# Patient Record
Sex: Male | Born: 1955
Health system: Southern US, Community
[De-identification: ages and names within clinical notes are randomized; demographics above are authoritative.]

## PROBLEM LIST (undated history)

## (undated) DIAGNOSIS — M1990 Unspecified osteoarthritis, unspecified site: Secondary | ICD-10-CM

## (undated) DIAGNOSIS — J449 Chronic obstructive pulmonary disease, unspecified: Secondary | ICD-10-CM

## (undated) DIAGNOSIS — E785 Hyperlipidemia, unspecified: Secondary | ICD-10-CM

## (undated) DIAGNOSIS — Z9289 Personal history of other medical treatment: Secondary | ICD-10-CM

## (undated) DIAGNOSIS — I10 Essential (primary) hypertension: Secondary | ICD-10-CM

## (undated) DIAGNOSIS — J189 Pneumonia, unspecified organism: Secondary | ICD-10-CM

## (undated) DIAGNOSIS — IMO0001 Reserved for inherently not codable concepts without codable children: Secondary | ICD-10-CM

## (undated) HISTORY — DX: Hyperlipidemia, unspecified: E78.5

---
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
## 1989-03-30 HISTORY — PX: BACK SURGERY: SHX140

## 1999-03-31 HISTORY — PX: OTHER SURGICAL HISTORY: SHX169

## 1999-09-18 ENCOUNTER — Ambulatory Visit: Admission: RE | Admit: 1999-09-18 | Discharge: 1999-09-18 | Payer: Self-pay | Admitting: Cardiovascular Disease

## 1999-10-27 ENCOUNTER — Ambulatory Visit (HOSPITAL_COMMUNITY): Admission: RE | Admit: 1999-10-27 | Discharge: 1999-10-27 | Payer: Self-pay | Admitting: Orthopaedic Surgery

## 2006-10-07 ENCOUNTER — Emergency Department: Payer: Self-pay | Admitting: Emergency Medicine

## 2013-03-20 ENCOUNTER — Encounter: Payer: Self-pay | Admitting: Orthopedic Surgery

## 2013-03-30 ENCOUNTER — Encounter: Payer: Self-pay | Admitting: Orthopedic Surgery

## 2013-04-30 ENCOUNTER — Encounter: Payer: Self-pay | Admitting: Orthopedic Surgery

## 2013-05-28 ENCOUNTER — Encounter: Payer: Self-pay | Admitting: Orthopedic Surgery

## 2013-12-21 ENCOUNTER — Encounter: Payer: Self-pay | Admitting: Podiatry

## 2013-12-21 ENCOUNTER — Ambulatory Visit (INDEPENDENT_AMBULATORY_CARE_PROVIDER_SITE_OTHER): Payer: Medicaid Other | Admitting: Podiatry

## 2013-12-21 VITALS — BP 139/72 | HR 99 | Resp 16 | Ht 68.0 in | Wt 195.0 lb

## 2013-12-21 DIAGNOSIS — L608 Other nail disorders: Secondary | ICD-10-CM

## 2013-12-21 DIAGNOSIS — L600 Ingrowing nail: Secondary | ICD-10-CM

## 2013-12-21 DIAGNOSIS — D492 Neoplasm of unspecified behavior of bone, soft tissue, and skin: Secondary | ICD-10-CM

## 2013-12-21 DIAGNOSIS — Q828 Other specified congenital malformations of skin: Secondary | ICD-10-CM

## 2013-12-21 NOTE — Progress Notes (Signed)
   Subjective:    [name], male    DOB: 10-16-1955, 58 y.o.   MRN: [id number]  HPI Comments: i have places on the bottom of my feet. Ive had them for a long time. The places on my feet hurt. It hurts to walk and stand. i use razor blades on my feet, a brush on my feet, pedi egg, and file my feet.  Foot Pain Associated symptoms include coughing.      Review of Systems  HENT: Positive for sinus pressure.   Respiratory: Positive for cough, shortness of breath and wheezing.   Cardiovascular: Positive for leg swelling.  Endocrine:       Excessive thirst  Musculoskeletal: Positive for back pain.       Joint pain Difficulty walking  All other systems reviewed and are negative.      Objective:   Physical Exam: Reviewed his past medical history medications allergies surgeries social history and review of systems. Pulses are strongly palpable bilateral. Neurologic sensorium is intact per Semmes-Weinstein monofilament. Deep tendon reflexes are intact bilateral muscle strength is 5 over 5 dorsiflexors plantar flexors inverters everters all intrinsic musculature is intact. The evaluation demonstrates all joints distal to the ankle a full range of motion without crepitation. Cutaneous evaluation demonstrates supple well hydrated cutis porokeratotic lesion sub-first and sub-fifth bilaterally. He also has thick dystrophic possibly mycotic nails which are painful on debridement and on palpation.        Assessment & Plan:  Assessment: Porokeratosis bilateral foot. Nail dystrophy bilateral foot.  Plan: Discussed etiology pathology conservative versus surgical therapies. For example the nails and skin today from the hallux nails bilaterally and sent for pathologic evaluation. I also denucleated lesions today he can actually sub-fifth bilaterally. I then applied salicylic acid under occlusion to the left for 3 days and then washed off thoroughly we discussed the possible need for  orthotics and I will followup with him in the near future.

## 2014-01-08 ENCOUNTER — Encounter: Payer: Self-pay | Admitting: Podiatry

## 2015-01-22 ENCOUNTER — Ambulatory Visit: Payer: Self-pay | Admitting: Orthopedic Surgery

## 2015-01-22 NOTE — Progress Notes (Signed)
Preoperative surgical orders have been place into the Epic hospital system for David Anderson on 01/22/2015, 5:28 PM  by Mickel Crow for surgery on 02-11-15.  Preop Total Hip - Anterior Approach orders including IV Tylenol, and IV Decadron as long as there are no contraindications to the above medications. Arlee Muslim, PA-C

## 2015-01-29 ENCOUNTER — Other Ambulatory Visit (HOSPITAL_COMMUNITY): Payer: Self-pay | Admitting: *Deleted

## 2015-01-29 NOTE — Patient Instructions (Addendum)
David Anderson  01/29/2015   Your procedure is scheduled on: 02-11-15  Report to [facility]  Entrance take [facility]  elevators to 3rd floor to  [place] at 1155 am  Call this number if you have problems the morning of surgery [phone]   Remember: ONLY 1 PERSON MAY GO WITH YOU TO SHORT STAY TO GET  READY MORNING OF YOUR SURGERY.  Do not eat food  :After Midnight, clear liquids midnight until 900 am day of surgery, nothing by mouth after 920 am day of surgery.      Take these medicines the morning of surgery with A SIP OF WATER: flonase nasal spray, spriva, advair DO NOT TAKE ANY DIABETIC MEDICATIONS DAY OF YOUR SURGERY                               You may not have any metal on your body including hair pins and              piercings  Do not wear jewelry, make-up, lotions, powders or perfumes, deodorant             Do not wear nail polish.  Do not shave  48 hours prior to surgery.              Men may shave face and neck.   Do not bring valuables to the hospital. [place].  Contacts, dentures or bridgework may not be worn into surgery.  Leave suitcase in the car. After surgery it may be brought to your room.     Patients discharged the day of surgery will not be allowed to drive home.  Name and phone number of your driver:  Special Instructions: N/A              Please read over the following fact sheets you were given: _____________________________________________________________________                CLEAR LIQUID DIET   Foods Allowed                                                                     Foods Excluded  Coffee and tea, regular and decaf                             liquids that you cannot  Plain Jell-O in any flavor                                             see through such as: Fruit ices (not with fruit pulp)                                     milk, soups, orange  juice  Iced  Popsicles                                    All solid food Carbonated beverages, regular and diet                                    Cranberry, grape and apple juices Sports drinks like Gatorade Lightly seasoned clear broth or consume(fat free) Sugar, honey syrup  Sample Menu Breakfast                                Lunch                                     Supper Cranberry juice                    Beef broth                            Chicken broth Jell-O                                     Grape juice                           Apple juice Coffee or tea                        Jell-O                                      Popsicle                                                Coffee or tea                        Coffee or tea  _____________________________________________________________________  [facility] - Preparing for Surgery Before surgery, you can play an important role.  Because skin is not sterile, your skin needs to be as free of germs as possible.  You can reduce the number of germs on your skin by washing with CHG (chlorahexidine gluconate) soap before surgery.  CHG is an antiseptic cleaner which kills germs and bonds with the skin to continue killing germs even after washing. Please DO NOT use if you have an allergy to CHG or antibacterial soaps.  If your skin becomes reddened/irritated stop using the CHG and inform your nurse when you arrive at Short Stay. Do not shave (including legs and underarms) for at least 48 hours prior to the first CHG shower.  You may shave your face/neck. Please follow these instructions carefully:  1.  Shower with CHG Soap the night before surgery and the  morning of Surgery.  2.  If you choose to wash your hair, wash your hair first  as usual with your  normal  shampoo.  3.  After you shampoo, rinse your hair and body thoroughly to remove the  shampoo.                           4.  Use CHG as you would any other liquid soap.  You can  apply chg directly  to the skin and wash                       Gently with a scrungie or clean washcloth.  5.  Apply the CHG Soap to your body ONLY FROM THE NECK DOWN.   Do not use on face/ open                           Wound or open sores. Avoid contact with eyes, ears mouth and genitals (private parts).                       Wash face,  Genitals (private parts) with your normal soap.             6.  Wash thoroughly, paying special attention to the area where your surgery  will be performed.  7.  Thoroughly rinse your body with warm water from the neck down.  8.  DO NOT shower/wash with your normal soap after using and rinsing off  the CHG Soap.                9.  Pat yourself dry with a clean towel.            10.  Wear clean pajamas.            11.  Place clean sheets on your bed the night of your first shower and do not  sleep with pets. Day of Surgery : Do not apply any lotions/deodorants the morning of surgery.  Please wear clean clothes to the hospital/surgery center.  FAILURE TO FOLLOW THESE INSTRUCTIONS MAY RESULT IN THE CANCELLATION OF YOUR SURGERY PATIENT SIGNATURE_________________________________  NURSE SIGNATURE__________________________________  ________________________________________________________________________   David Anderson  An incentive spirometer is a tool that can help keep your lungs clear and active. This tool measures how well you are filling your lungs with each breath. Taking long deep breaths may help reverse or decrease the chance of developing breathing (pulmonary) problems (especially infection) following:  A long period of time when you are unable to move or be active. BEFORE THE PROCEDURE   If the spirometer includes an indicator to show your best effort, your nurse or respiratory therapist will set it to a desired goal.  If possible, sit up straight or lean slightly forward. Try not to slouch.  Hold the incentive spirometer in an upright  position. INSTRUCTIONS FOR USE   Sit on the edge of your bed if possible, or sit up as far as you can in bed or on a chair.  Hold the incentive spirometer in an upright position.  Breathe out normally.  Place the mouthpiece in your mouth and seal your lips tightly around it.  Breathe in slowly and as deeply as possible, raising the piston or the ball toward the top of the column.  Hold your breath for 3-5 seconds or for as long as possible. Allow the piston or ball to fall to the bottom of the column.  Remove the  mouthpiece from your mouth and breathe out normally.  Rest for a few seconds and repeat Steps 1 through 7 at least 10 times every 1-2 hours when you are awake. Take your time and take a few normal breaths between deep breaths.  The spirometer may include an indicator to show your best effort. Use the indicator as a goal to work toward during each repetition.  After each set of 10 deep breaths, practice coughing to be sure your lungs are clear. If you have an incision (the cut made at the time of surgery), support your incision when coughing by placing a pillow or rolled up towels firmly against it. Once you are able to get out of bed, walk around indoors and cough well. You may stop using the incentive spirometer when instructed by your caregiver.  RISKS AND COMPLICATIONS  Take your time so you do not get dizzy or light-headed.  If you are in pain, you may need to take or ask for pain medication before doing incentive spirometry. It is harder to take a deep breath if you are having pain. AFTER USE  Rest and breathe slowly and easily.  It can be helpful to keep track of a log of your progress. Your caregiver can provide you with a simple table to help with this. If you are using the spirometer at home, follow these instructions: Oval IF:   You are having difficultly using the spirometer.  You have trouble using the spirometer as often as instructed.  Your  pain medication is not giving enough relief while using the spirometer.  You develop fever of 100.5 F (38.1 C) or higher. SEEK IMMEDIATE MEDICAL CARE IF:   You cough up bloody sputum that had not been present before.  You develop fever of 102 F (38.9 C) or greater.  You develop worsening pain at or near the incision site. MAKE SURE YOU:   Understand these instructions.  Will watch your condition.  Will get help right away if you are not doing well or get worse. Document Released: 07/27/2006 Document Revised: 06/08/2011 Document Reviewed: 09/27/2006 Exit[employer] Patient Information 2014 [employer], [place].   ________________________________________________________________________  WHAT IS A BLOOD TRANSFUSION? Blood Transfusion Information  A transfusion is the replacement of blood or some of its parts. Blood is made up of multiple cells which provide different functions.  Red blood cells carry oxygen and are used for blood loss replacement.  White blood cells fight against infection.  Platelets control bleeding.  Plasma helps clot blood.  Other blood products are available for specialized needs, such as hemophilia or other clotting disorders. BEFORE THE TRANSFUSION  Who gives blood for transfusions?   Healthy volunteers who are fully evaluated to make sure their blood is safe. This is blood bank blood. Transfusion therapy is the safest it has ever been in the practice of medicine. Before blood is taken from a donor, a complete history is taken to make sure that person has no history of diseases nor engages in risky social behavior (examples are intravenous drug use or sexual activity with multiple partners). The donor's travel history is screened to minimize risk of transmitting infections, such as malaria. The donated blood is tested for signs of infectious diseases, such as HIV and hepatitis. The blood is then tested to be sure it is compatible with you in order to minimize the  chance of a transfusion reaction. If you or a relative donates blood, this is often done in anticipation of surgery  and is not appropriate for emergency situations. It takes many days to process the donated blood. RISKS AND COMPLICATIONS Although transfusion therapy is very safe and saves many lives, the main dangers of transfusion include:   Getting an infectious disease.  Developing a transfusion reaction. This is an allergic reaction to something in the blood you were given. Every precaution is taken to prevent this. The decision to have a blood transfusion has been considered carefully by your caregiver before blood is given. Blood is not given unless the benefits outweigh the risks. AFTER THE TRANSFUSION  Right after receiving a blood transfusion, you will usually feel much better and more energetic. This is especially true if your red blood cells have gotten low (anemic). The transfusion raises the level of the red blood cells which carry oxygen, and this usually causes an energy increase.  The nurse administering the transfusion will monitor you carefully for complications. HOME CARE INSTRUCTIONS  No special instructions are needed after a transfusion. You may find your energy is better. Speak with your caregiver about any limitations on activity for underlying diseases you may have. SEEK MEDICAL CARE IF:   Your condition is not improving after your transfusion.  You develop redness or irritation at the intravenous (IV) site. SEEK IMMEDIATE MEDICAL CARE IF:  Any of the following symptoms occur over the next 12 hours:  Shaking chills.  You have a temperature by mouth above 102 F (38.9 C), not controlled by medicine.  Chest, back, or muscle pain.  People around you feel you are not acting correctly or are confused.  Shortness of breath or difficulty breathing.  Dizziness and fainting.  You get a rash or develop hives.  You have a decrease in urine output.  Your urine  turns a dark color or changes to pink, red, or brown. Any of the following symptoms occur over the next 10 days:  You have a temperature by mouth above 102 F (38.9 C), not controlled by medicine.  Shortness of breath.  Weakness after normal activity.  The white part of the eye turns yellow (jaundice).  You have a decrease in the amount of urine or are urinating less often.  Your urine turns a dark color or changes to pink, red, or brown. Document Released: 03/13/2000 Document Revised: 06/08/2011 Document Reviewed: 10/31/2007 [facility] Patient Information 2014 [place], [place].  _______________________________________________________________________

## 2015-01-30 ENCOUNTER — Encounter (HOSPITAL_COMMUNITY): Payer: Self-pay

## 2015-01-30 ENCOUNTER — Encounter (HOSPITAL_COMMUNITY)
Admission: RE | Admit: 2015-01-30 | Discharge: 2015-01-30 | Disposition: A | Discharge status: Home / Self Care | Payer: 59 | Source: Ambulatory Visit | Attending: Orthopedic Surgery | Admitting: Orthopedic Surgery

## 2015-01-30 DIAGNOSIS — Z01818 Encounter for other preprocedural examination: Secondary | ICD-10-CM | POA: Diagnosis present

## 2015-01-30 DIAGNOSIS — M1612 Unilateral primary osteoarthritis, left hip: Secondary | ICD-10-CM | POA: Insufficient documentation

## 2015-01-30 HISTORY — DX: Pneumonia, unspecified organism: J18.9

## 2015-01-30 HISTORY — DX: Chronic obstructive pulmonary disease, unspecified: J44.9

## 2015-01-30 HISTORY — DX: Personal history of other medical treatment: Z92.89

## 2015-01-30 HISTORY — DX: Reserved for inherently not codable concepts without codable children: IMO0001

## 2015-01-30 HISTORY — DX: Essential (primary) hypertension: I10

## 2015-01-30 HISTORY — DX: Unspecified osteoarthritis, unspecified site: M19.90

## 2015-01-30 LAB — COMPREHENSIVE METABOLIC PANEL
ALK PHOS: 49 U/L (ref 38–126)
ALT: 51 U/L (ref 17–63)
AST: 41 U/L (ref 15–41)
Albumin: 4.4 g/dL (ref 3.5–5.0)
Anion gap: 8 (ref 5–15)
BUN: 22 mg/dL — AB (ref 6–20)
CHLORIDE: 99 mmol/L — AB (ref 101–111)
CO2: 29 mmol/L (ref 22–32)
CREATININE: 1.09 mg/dL (ref 0.61–1.24)
Calcium: 9.6 mg/dL (ref 8.9–10.3)
GFR calc Af Amer: >60 mL/min (ref >60)
GFR calc non Af Amer: >60 mL/min (ref >60)
Glucose, Bld: 107 mg/dL — ABNORMAL HIGH (ref 65–99)
Potassium: 4.5 mmol/L (ref 3.5–5.1)
SODIUM: 136 mmol/L (ref 135–145)
Total Bilirubin: 0.6 mg/dL (ref 0.3–1.2)
Total Protein: 7.1 g/dL (ref 6.5–8.1)

## 2015-01-30 LAB — CBC
HCT: 45.5 % (ref 39.0–52.0)
Hemoglobin: 15.7 g/dL (ref 13.0–17.0)
MCH: 32.2 pg (ref 26.0–34.0)
MCHC: 34.5 g/dL (ref 30.0–36.0)
MCV: 93.2 fL (ref 78.0–100.0)
PLATELETS: 191 10*3/uL (ref 150–400)
RBC: 4.88 MIL/uL (ref 4.22–5.81)
RDW: 12.8 % (ref 11.5–15.5)
WBC: 8 10*3/uL (ref 4.0–10.5)

## 2015-01-30 LAB — URINALYSIS, ROUTINE W REFLEX MICROSCOPIC
BILIRUBIN URINE: NEGATIVE
Glucose, UA: NEGATIVE mg/dL
Hgb urine dipstick: NEGATIVE
KETONES UR: NEGATIVE mg/dL
Leukocytes, UA: NEGATIVE
NITRITE: NEGATIVE
PROTEIN: NEGATIVE mg/dL
Specific Gravity, Urine: 1.02 (ref 1.005–1.030)
UROBILINOGEN UA: 0.2 mg/dL (ref 0.0–1.0)
pH: 6 (ref 5.0–8.0)

## 2015-01-30 LAB — TYPE AND SCREEN
ABO/RH(D): A POS
Antibody Screen: NEGATIVE

## 2015-01-30 LAB — APTT: aPTT: 35 seconds (ref 24–37)

## 2015-01-30 LAB — SURGICAL PCR SCREEN
MRSA, PCR: NEGATIVE
STAPHYLOCOCCUS AUREUS: NEGATIVE

## 2015-01-30 LAB — PROTIME-INR
INR: 1.05 (ref 0.00–1.49)
Prothrombin Time: 13.9 seconds (ref 11.6–15.2)

## 2015-01-30 LAB — ABO/RH: ABO/RH(D): A POS

## 2015-01-30 NOTE — Progress Notes (Signed)
Spoke with dr Marcell Barlow and made aware 01-30-15 ekg results and pt has history of abnormal ekg in past, but last ekg was 2001 and unable to get any old ekg, pt ok for sugrery per dr Marcell Barlow

## 2015-01-30 NOTE — Progress Notes (Signed)
   01/30/15 1313  OBSTRUCTIVE SLEEP APNEA  Have you ever been diagnosed with sleep apnea through a sleep study? No  Do you snore loudly (loud enough to be heard through closed doors)?  1  Do you often feel tired, fatigued, or sleepy during the daytime (such as falling asleep during driving or talking to someone)? 1  Has anyone observed you stop breathing during your sleep? 0  Do you have, or are you being treated for high blood pressure? 1  BMI more than 35 kg/m2? 0  Age > 50 (1-yes) 1  Neck circumference greater than:Male 16 inches or larger, Male 17inches or larger? 0  Male Gender (Yes=1) 1  Obstructive Sleep Apnea Score 5  Score 5 or greater  Results sent to PCP

## 2015-02-06 NOTE — H&P (Signed)
TOTAL HIP ADMISSION H&P  Patient is admitted for left total hip arthroplasty.  Subjective:  Chief Complaint: left hip pain  HPI: David FIDALGO, 59 y.o. male, has a history of pain and functional disability in the left hip(s) due to trauma and arthritis and patient has failed non-surgical conservative treatments for greater than 12 weeks to include NSAID's and/or analgesics, flexibility and strengthening excercises and activity modification.  Onset of symptoms was gradual starting 2 years ago with gradually worsening course since that time.The patient noted no past surgery on the left hip(s).  Patient currently rates pain in the left hip at 8 out of 10 with activity. Patient has night pain, worsening of pain with activity and weight bearing, pain that interfers with activities of daily living, pain with passive range of motion, and crepitus. Patient has evidence of subchondral cysts, subchondral sclerosis, periarticular osteophytes, joint space narrowing and necrosis of the femoral head by imaging studies. This condition presents safety issues increasing the risk of falls.   There is no current active infection.   Past Medical History  Diagnosis Date  . Hypertension   . COPD (chronic obstructive pulmonary disease) ([place])   . Arthritis     oa  . Shortness of breath dyspnea     with exertion  . Pneumonia years ago    walking pneumonia  . History of blood transfusion 1973    Past Surgical History  Procedure Laterality Date  . Surgery for staph infection  1973    both legs left worse than right, both legs open and drained  . Left shoulder rotator cuff repair  2001  . Back surgery  1991    lower back     No Known Allergies  Social History  Substance Use Topics  . Smoking status: Current Every Day Smoker -- 1.00 packs/day for 35 years    Types: Cigarettes  . Smokeless tobacco: Never Used  . Alcohol Use: Yes     Comment: social       Review of Systems  Constitutional: Negative.    HENT: Negative.   Eyes: Negative.   Respiratory: Positive for shortness of breath and wheezing. Negative for cough, hemoptysis and sputum production.        SOB with exertion  Cardiovascular: Negative.   Gastrointestinal: Negative.   Genitourinary: Negative.   Musculoskeletal: Positive for myalgias, back pain and joint pain. Negative for falls and neck pain.       Left hip pain  Skin: Negative.   Neurological: Negative.   Endo/Heme/Allergies: Negative.   Psychiatric/Behavioral: Negative.     Objective:  Physical Exam  Constitutional: He is oriented to person, place, and time. He appears well-developed and well-nourished. No distress.  HENT:  Head: Normocephalic and atraumatic.  Right Ear: External ear normal.  Left Ear: External ear normal.  Nose: Nose normal.  Mouth/Throat: Oropharynx is clear and moist.  Eyes: Conjunctivae and EOM are normal.  Neck: Normal range of motion. Neck supple.  Cardiovascular: Normal rate, regular rhythm, normal heart sounds and intact distal pulses.   No murmur heard. Respiratory: Effort normal. No respiratory distress. He has wheezes.  GI: Soft. Bowel sounds are normal. He exhibits no distension. There is no tenderness.  Musculoskeletal:       Right hip: Normal.       Left hip: He exhibits decreased range of motion and crepitus.       Right knee: Normal.       Left knee: He exhibits no effusion  and no erythema. Tenderness found. Medial joint line tenderness noted. No lateral joint line tenderness noted.  His right hip can be flexed to 120, rotated in 30, out 30, abducted 40 without discomfort. Right knee range 0 to 135. No swelling, tenderness or instability. Left hip flexion 90, no internal or external rotation, no abduction. With the exception of flexion extension, the hip is almost fused. His left knee shows lot of scarring medially from his old infection. His range is about 0 to 125. There is crepitus on range of motion, slight tenderness  medially and no lateral tenderness or instability noted.  Neurological: He is alert and oriented to person, place, and time. He has normal strength and normal reflexes. No sensory deficit.  Skin: No rash noted. He is not diaphoretic. No erythema.  Psychiatric: He has a normal mood and affect. His behavior is normal.   Vitals  Weight: 195 lb Height: 69in Body Surface Area: 2.04 m Body Mass Index: 28.8 kg/m  Pulse: 76 (Regular)  BP: 162/84 (Sitting, Left Arm, Standard)  Imaging Review Plain radiographs demonstrate severe degenerative joint disease of the left hip(s). The bone quality appears to be fair for age and reported activity level.  Assessment/Plan:  End stage primary osteoarthritis, left hip(s)  The patient history, physical examination, clinical judgement of the provider and imaging studies are consistent with end stage degenerative joint disease of the left hip(s) and total hip arthroplasty is deemed medically necessary. The treatment options including medical management, injection therapy, arthroscopy and arthroplasty were discussed at length. The risks and benefits of total hip arthroplasty were presented and reviewed. The risks due to aseptic loosening, infection, stiffness, dislocation/subluxation,  thromboembolic complications and other imponderables were discussed.  The patient acknowledged the explanation, agreed to proceed with the plan and consent was signed. Patient is being admitted for inpatient treatment for surgery, pain control, PT, OT, prophylactic antibiotics, VTE prophylaxis, progressive ambulation and ADL's and discharge planning.The patient is planning to be discharged home with home health services    PCP: Dr. Durenda Age, PA-C

## 2015-02-11 ENCOUNTER — Inpatient Hospital Stay (HOSPITAL_COMMUNITY): Payer: 59

## 2015-02-11 ENCOUNTER — Encounter (HOSPITAL_COMMUNITY)
Admission: RE | Disposition: A | Discharge status: Home Health | Payer: Self-pay | Source: Ambulatory Visit | Attending: Orthopedic Surgery

## 2015-02-11 ENCOUNTER — Inpatient Hospital Stay (HOSPITAL_COMMUNITY): Payer: 59 | Admitting: Certified Registered Nurse Anesthetist

## 2015-02-11 ENCOUNTER — Encounter (HOSPITAL_COMMUNITY): Payer: Self-pay | Admitting: *Deleted

## 2015-02-11 ENCOUNTER — Inpatient Hospital Stay (HOSPITAL_COMMUNITY)
Admission: RE | Admit: 2015-02-11 | Discharge: 2015-02-13 | DRG: 470 | Disposition: A | Discharge status: Home Health | Payer: 59 | Source: Ambulatory Visit | Attending: Orthopedic Surgery | Admitting: Orthopedic Surgery

## 2015-02-11 DIAGNOSIS — I10 Essential (primary) hypertension: Secondary | ICD-10-CM | POA: Diagnosis present

## 2015-02-11 DIAGNOSIS — Z01812 Encounter for preprocedural laboratory examination: Secondary | ICD-10-CM | POA: Diagnosis not present

## 2015-02-11 DIAGNOSIS — F17210 Nicotine dependence, cigarettes, uncomplicated: Secondary | ICD-10-CM | POA: Diagnosis present

## 2015-02-11 DIAGNOSIS — M25552 Pain in left hip: Secondary | ICD-10-CM | POA: Diagnosis present

## 2015-02-11 DIAGNOSIS — M1612 Unilateral primary osteoarthritis, left hip: Principal | ICD-10-CM | POA: Diagnosis present

## 2015-02-11 DIAGNOSIS — J449 Chronic obstructive pulmonary disease, unspecified: Secondary | ICD-10-CM | POA: Diagnosis present

## 2015-02-11 DIAGNOSIS — M169 Osteoarthritis of hip, unspecified: Secondary | ICD-10-CM | POA: Diagnosis present

## 2015-02-11 DIAGNOSIS — Z96649 Presence of unspecified artificial hip joint: Secondary | ICD-10-CM

## 2015-02-11 HISTORY — PX: TOTAL HIP ARTHROPLASTY: SHX124

## 2015-02-11 SURGERY — ARTHROPLASTY, HIP, TOTAL, ANTERIOR APPROACH
Anesthesia: Spinal | Site: Hip | Laterality: Left

## 2015-02-11 MED ORDER — PROMETHAZINE HCL 25 MG/ML IJ SOLN: 6.2500 mg | INTRAMUSCULAR | Status: DC | PRN

## 2015-02-11 MED ORDER — TRANEXAMIC ACID 1000 MG/10ML IV SOLN
1000.0000 mg | INTRAVENOUS | Status: AC
  Administered 2015-02-11: 1000 mg via INTRAVENOUS
  Filled 2015-02-11: qty 10

## 2015-02-11 MED ORDER — ACETAMINOPHEN 325 MG PO TABS: 650.0000 mg | ORAL_TABLET | Freq: Four times a day (QID) | ORAL | Status: DC | PRN

## 2015-02-11 MED ORDER — METOCLOPRAMIDE HCL 10 MG PO TABS
5.0000 mg | ORAL_TABLET | Freq: Three times a day (TID) | ORAL | Status: DC | PRN
Start: 2015-02-11 — End: 2015-02-13

## 2015-02-11 MED ORDER — PROPOFOL 10 MG/ML IV BOLUS
INTRAVENOUS | Status: AC
  Filled 2015-02-11: qty 20

## 2015-02-11 MED ORDER — CHLORHEXIDINE GLUCONATE 4 % EX LIQD: 60.0000 mL | Freq: Once | CUTANEOUS | Status: DC

## 2015-02-11 MED ORDER — ONDANSETRON HCL 4 MG/2ML IJ SOLN
INTRAMUSCULAR | Status: DC | PRN
  Administered 2015-02-11: 4 mg via INTRAVENOUS

## 2015-02-11 MED ORDER — KETOROLAC TROMETHAMINE 15 MG/ML IJ SOLN: 7.5000 mg | Freq: Four times a day (QID) | INTRAMUSCULAR | Status: AC | PRN

## 2015-02-11 MED ORDER — CEFAZOLIN SODIUM-DEXTROSE 2-3 GM-% IV SOLR
INTRAVENOUS | Status: AC
  Filled 2015-02-11: qty 50

## 2015-02-11 MED ORDER — ONDANSETRON HCL 4 MG PO TABS: 4.0000 mg | ORAL_TABLET | Freq: Four times a day (QID) | ORAL | Status: DC | PRN

## 2015-02-11 MED ORDER — FENTANYL CITRATE (PF) 100 MCG/2ML IJ SOLN: 25.0000 ug | INTRAMUSCULAR | Status: DC | PRN

## 2015-02-11 MED ORDER — LABETALOL HCL 5 MG/ML IV SOLN
INTRAVENOUS | Status: AC
  Filled 2015-02-11: qty 4

## 2015-02-11 MED ORDER — TRAZODONE HCL 50 MG PO TABS
50.0000 mg | ORAL_TABLET | Freq: Every day | ORAL | Status: DC
  Administered 2015-02-11 – 2015-02-12 (×2): 50 mg via ORAL
  Filled 2015-02-11 (×2): qty 1

## 2015-02-11 MED ORDER — CEFAZOLIN SODIUM-DEXTROSE 2-3 GM-% IV SOLR
2.0000 g | INTRAVENOUS | Status: AC
  Administered 2015-02-11: 2 g via INTRAVENOUS

## 2015-02-11 MED ORDER — FENTANYL CITRATE (PF) 100 MCG/2ML IJ SOLN
INTRAMUSCULAR | Status: DC | PRN
Start: 2015-02-11 — End: 2015-02-11
  Administered 2015-02-11: 25 ug via INTRAVENOUS
  Administered 2015-02-11: 50 ug via INTRAVENOUS
  Administered 2015-02-11: 25 ug via INTRAVENOUS

## 2015-02-11 MED ORDER — ONDANSETRON HCL 4 MG/2ML IJ SOLN
INTRAMUSCULAR | Status: AC
  Filled 2015-02-11: qty 2

## 2015-02-11 MED ORDER — PHENOL 1.4 % MT LIQD
1.0000 | OROMUCOSAL | Status: DC | PRN
  Filled 2015-02-11: qty 177

## 2015-02-11 MED ORDER — DEXAMETHASONE SODIUM PHOSPHATE 10 MG/ML IJ SOLN
10.0000 mg | Freq: Once | INTRAMUSCULAR | Status: AC
  Administered 2015-02-12: 10 mg via INTRAVENOUS
  Filled 2015-02-11: qty 1

## 2015-02-11 MED ORDER — BUPIVACAINE-EPINEPHRINE (PF) 0.25% -1:200000 IJ SOLN
INTRAMUSCULAR | Status: AC
Start: 2015-02-11 — End: 2015-02-11
  Filled 2015-02-11: qty 30

## 2015-02-11 MED ORDER — METHOCARBAMOL 1000 MG/10ML IJ SOLN
500.0000 mg | Freq: Four times a day (QID) | INTRAVENOUS | Status: DC | PRN
  Filled 2015-02-11: qty 5

## 2015-02-11 MED ORDER — ACETAMINOPHEN 10 MG/ML IV SOLN
INTRAVENOUS | Status: AC
  Filled 2015-02-11: qty 100

## 2015-02-11 MED ORDER — CEFAZOLIN SODIUM-DEXTROSE 2-3 GM-% IV SOLR: 2.0000 g | INTRAVENOUS | Status: DC

## 2015-02-11 MED ORDER — SODIUM CHLORIDE 0.9 % IJ SOLN
INTRAMUSCULAR | Status: AC
  Filled 2015-02-11: qty 50

## 2015-02-11 MED ORDER — SODIUM CHLORIDE 0.9 % IV SOLN
INTRAVENOUS | Status: DC
  Administered 2015-02-11: 19:00:00 via INTRAVENOUS

## 2015-02-11 MED ORDER — CHLORTHALIDONE 25 MG PO TABS
25.0000 mg | ORAL_TABLET | Freq: Every morning | ORAL | Status: DC
  Administered 2015-02-12 – 2015-02-13 (×2): 25 mg via ORAL
  Filled 2015-02-11 (×2): qty 1

## 2015-02-11 MED ORDER — ACETAMINOPHEN 650 MG RE SUPP: 650.0000 mg | Freq: Four times a day (QID) | RECTAL | Status: DC | PRN

## 2015-02-11 MED ORDER — MOMETASONE FURO-FORMOTEROL FUM 100-5 MCG/ACT IN AERO
2.0000 | INHALATION_SPRAY | Freq: Two times a day (BID) | RESPIRATORY_TRACT | Status: DC
  Administered 2015-02-11 – 2015-02-13 (×4): 2 via RESPIRATORY_TRACT
  Filled 2015-02-11: qty 8.8

## 2015-02-11 MED ORDER — TRAMADOL HCL 50 MG PO TABS: 50.0000 mg | ORAL_TABLET | Freq: Four times a day (QID) | ORAL | Status: DC | PRN

## 2015-02-11 MED ORDER — STERILE WATER FOR IRRIGATION IR SOLN
Status: DC | PRN
  Administered 2015-02-11: 1000 mL

## 2015-02-11 MED ORDER — SODIUM CHLORIDE 0.9 % IV SOLN: INTRAVENOUS | Status: DC

## 2015-02-11 MED ORDER — FLUTICASONE PROPIONATE 50 MCG/ACT NA SUSP
1.0000 | Freq: Every day | NASAL | Status: DC
  Administered 2015-02-12 – 2015-02-13 (×2): 1 via NASAL
  Filled 2015-02-11: qty 16

## 2015-02-11 MED ORDER — PHENYLEPHRINE HCL 10 MG/ML IJ SOLN
INTRAMUSCULAR | Status: DC | PRN
  Administered 2015-02-11: 40 ug via INTRAVENOUS
  Administered 2015-02-11 (×2): 80 ug via INTRAVENOUS

## 2015-02-11 MED ORDER — POLYETHYLENE GLYCOL 3350 17 G PO PACK: 17.0000 g | PACK | Freq: Every day | ORAL | Status: DC | PRN

## 2015-02-11 MED ORDER — FENTANYL CITRATE (PF) 100 MCG/2ML IJ SOLN
INTRAMUSCULAR | Status: AC
  Filled 2015-02-11: qty 4

## 2015-02-11 MED ORDER — DOCUSATE SODIUM 100 MG PO CAPS
100.0000 mg | ORAL_CAPSULE | Freq: Two times a day (BID) | ORAL | Status: DC
  Administered 2015-02-11 – 2015-02-13 (×4): 100 mg via ORAL

## 2015-02-11 MED ORDER — POTASSIUM GLUCONATE 550 MG PO TABS
1.0000 | ORAL_TABLET | Freq: Every day | ORAL | Status: DC
  Administered 2015-02-12 – 2015-02-13 (×2): 550 mg via ORAL
  Filled 2015-02-11 (×2): qty 1

## 2015-02-11 MED ORDER — IPRATROPIUM-ALBUTEROL 0.5-2.5 (3) MG/3ML IN SOLN: 3.0000 mL | Freq: Four times a day (QID) | RESPIRATORY_TRACT | Status: DC | PRN

## 2015-02-11 MED ORDER — 0.9 % SODIUM CHLORIDE (POUR BTL) OPTIME
TOPICAL | Status: DC | PRN
  Administered 2015-02-11: 1000 mL

## 2015-02-11 MED ORDER — CEFAZOLIN SODIUM-DEXTROSE 2-3 GM-% IV SOLR
2.0000 g | Freq: Four times a day (QID) | INTRAVENOUS | Status: AC
  Administered 2015-02-11 – 2015-02-12 (×2): 2 g via INTRAVENOUS
  Filled 2015-02-11 (×3): qty 50

## 2015-02-11 MED ORDER — MEPERIDINE HCL 50 MG/ML IJ SOLN: 6.2500 mg | INTRAMUSCULAR | Status: DC | PRN

## 2015-02-11 MED ORDER — RIVAROXABAN 10 MG PO TABS
10.0000 mg | ORAL_TABLET | Freq: Every day | ORAL | Status: DC
  Administered 2015-02-12 – 2015-02-13 (×2): 10 mg via ORAL
  Filled 2015-02-11 (×3): qty 1

## 2015-02-11 MED ORDER — METOCLOPRAMIDE HCL 5 MG/ML IJ SOLN: 5.0000 mg | Freq: Three times a day (TID) | INTRAMUSCULAR | Status: DC | PRN

## 2015-02-11 MED ORDER — LABETALOL HCL 5 MG/ML IV SOLN
INTRAVENOUS | Status: DC | PRN
Start: 2015-02-11 — End: 2015-02-11
  Administered 2015-02-11: 2.5 mg via INTRAVENOUS

## 2015-02-11 MED ORDER — MIDAZOLAM HCL 5 MG/5ML IJ SOLN
INTRAMUSCULAR | Status: DC | PRN
  Administered 2015-02-11: 2 mg via INTRAVENOUS

## 2015-02-11 MED ORDER — PROPOFOL 10 MG/ML IV BOLUS
INTRAVENOUS | Status: DC | PRN
  Administered 2015-02-11: 20 mg via INTRAVENOUS

## 2015-02-11 MED ORDER — ACETAMINOPHEN 500 MG PO TABS
1000.0000 mg | ORAL_TABLET | Freq: Four times a day (QID) | ORAL | Status: AC
  Administered 2015-02-11 – 2015-02-12 (×4): 1000 mg via ORAL
  Filled 2015-02-11 (×5): qty 2

## 2015-02-11 MED ORDER — MIDAZOLAM HCL 2 MG/2ML IJ SOLN
INTRAMUSCULAR | Status: AC
  Filled 2015-02-11: qty 4

## 2015-02-11 MED ORDER — DIPHENHYDRAMINE HCL 12.5 MG/5ML PO ELIX: 12.5000 mg | ORAL_SOLUTION | ORAL | Status: DC | PRN

## 2015-02-11 MED ORDER — BUPIVACAINE LIPOSOME 1.3 % IJ SUSP
20.0000 mL | Freq: Once | INTRAMUSCULAR | Status: DC
  Filled 2015-02-11: qty 20

## 2015-02-11 MED ORDER — PRAVASTATIN SODIUM 80 MG PO TABS
80.0000 mg | ORAL_TABLET | Freq: Every day | ORAL | Status: DC
Start: 2015-02-11 — End: 2015-02-13
  Administered 2015-02-11 – 2015-02-12 (×2): 80 mg via ORAL
  Filled 2015-02-11 (×3): qty 1

## 2015-02-11 MED ORDER — ACETAMINOPHEN 10 MG/ML IV SOLN
1000.0000 mg | Freq: Once | INTRAVENOUS | Status: AC
  Administered 2015-02-11: 1000 mg via INTRAVENOUS

## 2015-02-11 MED ORDER — MENTHOL 3 MG MT LOZG: 1.0000 | LOZENGE | OROMUCOSAL | Status: DC | PRN

## 2015-02-11 MED ORDER — BISACODYL 10 MG RE SUPP: 10.0000 mg | Freq: Every day | RECTAL | Status: DC | PRN

## 2015-02-11 MED ORDER — BUPIVACAINE-EPINEPHRINE 0.25% -1:200000 IJ SOLN
INTRAMUSCULAR | Status: DC | PRN
  Administered 2015-02-11: 30 mL

## 2015-02-11 MED ORDER — LACTATED RINGERS IV SOLN: INTRAVENOUS | Status: DC

## 2015-02-11 MED ORDER — ONDANSETRON HCL 4 MG/2ML IJ SOLN: 4.0000 mg | Freq: Four times a day (QID) | INTRAMUSCULAR | Status: DC | PRN

## 2015-02-11 MED ORDER — SODIUM CHLORIDE 0.9 % IJ SOLN
INTRAMUSCULAR | Status: DC | PRN
  Administered 2015-02-11: 30 mL

## 2015-02-11 MED ORDER — METHOCARBAMOL 500 MG PO TABS
500.0000 mg | ORAL_TABLET | Freq: Four times a day (QID) | ORAL | Status: DC | PRN
  Administered 2015-02-12 (×2): 500 mg via ORAL
  Filled 2015-02-11 (×2): qty 1

## 2015-02-11 MED ORDER — BUPIVACAINE HCL (PF) 0.5 % IJ SOLN
INTRAMUSCULAR | Status: AC
  Filled 2015-02-11: qty 30

## 2015-02-11 MED ORDER — LACTATED RINGERS IV SOLN
INTRAVENOUS | Status: DC
  Administered 2015-02-11: 1000 mL via INTRAVENOUS
  Administered 2015-02-11: 16:00:00 via INTRAVENOUS

## 2015-02-11 MED ORDER — DEXAMETHASONE SODIUM PHOSPHATE 10 MG/ML IJ SOLN
10.0000 mg | Freq: Once | INTRAMUSCULAR | Status: AC
  Administered 2015-02-11: 10 mg via INTRAVENOUS

## 2015-02-11 MED ORDER — PROPOFOL 500 MG/50ML IV EMUL
INTRAVENOUS | Status: DC | PRN
  Administered 2015-02-11: 50 ug/kg/min via INTRAVENOUS

## 2015-02-11 MED ORDER — TIOTROPIUM BROMIDE MONOHYDRATE 18 MCG IN CAPS
18.0000 ug | ORAL_CAPSULE | Freq: Every day | RESPIRATORY_TRACT | Status: DC
  Administered 2015-02-12 – 2015-02-13 (×2): 18 ug via RESPIRATORY_TRACT
  Filled 2015-02-11: qty 5

## 2015-02-11 MED ORDER — FLEET ENEMA 7-19 GM/118ML RE ENEM: 1.0000 | ENEMA | Freq: Once | RECTAL | Status: DC | PRN

## 2015-02-11 MED ORDER — BUPIVACAINE HCL (PF) 0.5 % IJ SOLN
INTRAMUSCULAR | Status: DC | PRN
  Administered 2015-02-11: 3 mL

## 2015-02-11 MED ORDER — OXYCODONE HCL 5 MG PO TABS
5.0000 mg | ORAL_TABLET | ORAL | Status: DC | PRN
  Administered 2015-02-11 – 2015-02-13 (×10): 10 mg via ORAL
  Filled 2015-02-11 (×10): qty 2

## 2015-02-11 SURGICAL SUPPLY — 36 items
BAG DECANTER FOR FLEXI CONT (MISCELLANEOUS) IMPLANT
BAG ZIPLOCK 12X15 (MISCELLANEOUS) IMPLANT
BLADE SAG 18X100X1.27 (BLADE) ×3 IMPLANT
CAPT HIP TOTAL 2 ×3 IMPLANT
CLOSURE WOUND 1/2 X4 (GAUZE/BANDAGES/DRESSINGS) ×1
CLOTH BEACON ORANGE TIMEOUT ST (SAFETY) ×3 IMPLANT
COVER PERINEAL POST (MISCELLANEOUS) ×3 IMPLANT
DECANTER SPIKE VIAL GLASS SM (MISCELLANEOUS) ×3 IMPLANT
DRAPE STERI IOBAN 125X83 (DRAPES) ×3 IMPLANT
DRAPE U-SHAPE 47X51 STRL (DRAPES) ×6 IMPLANT
DRSG ADAPTIC 3X8 NADH LF (GAUZE/BANDAGES/DRESSINGS) ×3 IMPLANT
DRSG MEPILEX BORDER 4X4 (GAUZE/BANDAGES/DRESSINGS) ×3 IMPLANT
DRSG MEPILEX BORDER 4X8 (GAUZE/BANDAGES/DRESSINGS) ×3 IMPLANT
DURAPREP 26ML APPLICATOR (WOUND CARE) ×3 IMPLANT
ELECT REM PT RETURN 9FT ADLT (ELECTROSURGICAL) ×3
ELECTRODE REM PT RTRN 9FT ADLT (ELECTROSURGICAL) ×1 IMPLANT
EVACUATOR 1/8 PVC DRAIN (DRAIN) ×3 IMPLANT
GLOVE BIO SURGEON STRL SZ7.5 (GLOVE) ×3 IMPLANT
GLOVE BIO SURGEON STRL SZ8 (GLOVE) ×3 IMPLANT
GLOVE BIOGEL PI IND STRL 8 (GLOVE) ×2 IMPLANT
GLOVE BIOGEL PI INDICATOR 8 (GLOVE) ×4
GOWN STRL REUS W/TWL LRG LVL3 (GOWN DISPOSABLE) ×3 IMPLANT
GOWN STRL REUS W/TWL XL LVL3 (GOWN DISPOSABLE) ×6 IMPLANT
PACK ANTERIOR HIP CUSTOM (KITS) ×3 IMPLANT
SPONGE SURGIFOAM ABS GEL 100 (HEMOSTASIS) ×3 IMPLANT
SPONGE SURGIFOAM ABS GEL 12-7 (HEMOSTASIS) ×3 IMPLANT
STRIP CLOSURE SKIN 1/2X4 (GAUZE/BANDAGES/DRESSINGS) ×2 IMPLANT
SUT ETHIBOND NAB CT1 #1 30IN (SUTURE) ×3 IMPLANT
SUT MNCRL AB 4-0 PS2 18 (SUTURE) ×3 IMPLANT
SUT VIC AB 2-0 CT1 27 (SUTURE) ×4
SUT VIC AB 2-0 CT1 TAPERPNT 27 (SUTURE) ×2 IMPLANT
SUT VLOC 180 0 24IN GS25 (SUTURE) ×3 IMPLANT
SYR 50ML LL SCALE MARK (SYRINGE) IMPLANT
TRAY FOLEY W/METER SILVER 14FR (SET/KITS/TRAYS/PACK) IMPLANT
TRAY FOLEY W/METER SILVER 16FR (SET/KITS/TRAYS/PACK) ×3 IMPLANT
YANKAUER SUCT BULB TIP 10FT TU (MISCELLANEOUS) ×3 IMPLANT

## 2015-02-11 NOTE — Interval H&P Note (Signed)
History and Physical Interval Note:  02/11/2015 1:48 PM  David Anderson  has presented today for surgery, with the diagnosis of OA LEFT HIP  The various methods of treatment have been discussed with the patient and family. After consideration of risks, benefits and other options for treatment, the patient has consented to  Procedure(s): TOTAL HIP ARTHROPLASTY ANTERIOR APPROACH (Left) as a surgical intervention .  The patient's history has been reviewed, patient examined, no change in status, stable for surgery.  I have reviewed the patient's chart and labs.  Questions were answered to the patient's satisfaction.     [name]

## 2015-02-11 NOTE — Op Note (Signed)
OPERATIVE REPORT  PREOPERATIVE DIAGNOSIS: Osteoarthritis of the Left hip.   POSTOPERATIVE DIAGNOSIS: Osteoarthritis of the Left  hip.   PROCEDURE: Left total hip arthroplasty, anterior approach.   SURGEON: Gaynelle Arabian, MD   ASSISTANT: Arlee Muslim, PA-C  ANESTHESIA:  Spinal  ESTIMATED BLOOD LOSS:-500 ml   DRAINS: Hemovac x1.   COMPLICATIONS: None   CONDITION: PACU - hemodynamically stable.   BRIEF CLINICAL NOTE: David Anderson is a 59 y.o. male who has advanced end-  stage arthritis of his Left  hip with progressively worsening pain and  dysfunction.The patient has failed nonoperative management and presents for  total hip arthroplasty.   PROCEDURE IN DETAIL: After successful administration of spinal  anesthetic, the traction boots for the [facility] bed were placed on both  feet and the patient was placed onto the [facility] bed, boots placed into the leg  holders. The Left hip was then isolated from the perineum with plastic  drapes and prepped and draped in the usual sterile fashion. ASIS and  greater trochanter were marked and a oblique incision was made, starting  at about 1 cm lateral and 2 cm distal to the ASIS and coursing towards  the anterior cortex of the femur. The skin was cut with a 10 blade  through subcutaneous tissue to the level of the fascia overlying the  tensor fascia lata muscle. The fascia was then incised in line with the  incision at the junction of the anterior third and posterior 2/3rd. The  muscle was teased off the fascia and then the interval between the TFL  and the rectus was developed. The Hohmann retractor was then placed at  the top of the femoral neck over the capsule. The vessels overlying the  capsule were cauterized and the fat on top of the capsule was removed.  A Hohmann retractor was then placed anterior underneath the rectus  femoris to give exposure to the entire anterior capsule. A T-shaped  capsulotomy was performed. The edges  were tagged and the femoral head  was identified.       Osteophytes are removed off the superior acetabulum.  The femoral neck was then cut in situ with an oscillating saw. Traction  was then applied to the left lower extremity utilizing the [facility]  traction. The femoral head was then removed. Retractors were placed  around the acetabulum and then circumferential removal of the labrum was  performed. Osteophytes were also removed. Reaming starts at 47 mm to  medialize and  Increased in 2 mm increments to 53 mm. We reamed in  approximately 40 degrees of abduction, 20 degrees anteversion. A 54 mm  pinnacle acetabular shell was then impacted in anatomic position under  fluoroscopic guidance with excellent purchase. We did not need to place  any additional dome screws. A 36 mm neutral + 4 marathon liner was then  placed into the acetabular shell.       The femoral lift was then placed along the lateral aspect of the femur  just distal to the vastus ridge. The leg was  externally rotated and capsule  was stripped off the inferior aspect of the femoral neck down to the  level of the lesser trochanter, this was done with electrocautery. The femur was lifted after this was performed. The  leg was then placed and extended in adducted position to essentially delivering the femur. We also removed the capsule superiorly and the  piriformis from the piriformis  fossa to gain excellent exposure of the  proximal femur. Rongeur was used to remove some cancellous bone to get  into the lateral portion of the proximal femur for placement of the  initial starter reamer. The starter broaches was placed  the starter broach  and was shown to go down the center of the canal. Broaching  with the  Corail system was then performed starting at size 8, coursing  Up to size 13. A size 13 had excellent torsional and rotational  and axial stability. The trial standard offset neck was then placed  with a 36 + 12 trial head.  The hip was then reduced. We confirmed that  the stem was in the canal both on AP and lateral x-rays. It also has excellent sizing. The hip was reduced with outstanding stability through full extension, full external rotation,  and then flexion in adduction internal rotation. AP pelvis was taken  and the leg lengths were measured and found to be exactly equal. Hip  was then dislocated again and the femoral head and neck removed. The  femoral broach was removed. Size 13 Corail stem with a standard offset  neck was then impacted into the femur following native anteversion. Has  excellent purchase in the canal. Excellent torsional and rotational and  axial stability. It is confirmed to be in the canal on AP and lateral  fluoroscopic views. The 36 + 12 ceramic head was placed and the hip  reduced with outstanding stability. Again AP pelvis was taken and it  confirmed that the leg lengths were equal. The wound was then copiously  irrigated with saline solution and the capsule reattached and repaired  with Ethibond suture. 30 ml of .25% Bupivicaine injected into the capsule and into the edge of the tensor fascia lata as well as subcutaneous tissue. The fascia overlying the tensor fascia lata was  then closed with a running #1 V-Loc. Subcu was closed with interrupted  2-0 Vicryl and subcuticular running 4-0 Monocryl. Incision was cleaned  and dried. Steri-Strips and a bulky sterile dressing applied. Hemovac  drain was hooked to suction and then he was awakened and transported to  recovery in stable condition.        Please note that a surgical assistant was a medical necessity for this procedure to perform it in a safe and expeditious manner. Assistant was necessary to provide appropriate retraction of vital neurovascular structures and to prevent femoral fracture and allow for anatomic placement of the prosthesis.  Gaynelle Arabian, M.D.

## 2015-02-11 NOTE — Anesthesia Postprocedure Evaluation (Signed)
  Anesthesia Post-op Note  Patient: David Anderson  Procedure(s) Performed: Procedure(s) (LRB): TOTAL HIP ARTHROPLASTY ANTERIOR APPROACH (Left)  Patient Location: PACU  Anesthesia Type: Spinal  Level of Consciousness: awake and alert   Airway and Oxygen Therapy: Patient Spontanous Breathing  Post-op Pain: mild  Post-op Assessment: Post-op Vital signs reviewed, Patient's Cardiovascular Status Stable, Respiratory Function Stable, Patent Airway and No signs of Nausea or vomiting  Last Vitals:  Filed Vitals:   02/11/15 1748  BP: 133/77  Pulse: 71  Temp: 36.8 C  Resp: 14    Post-op Vital Signs: stable   Complications: No apparent anesthesia complications

## 2015-02-11 NOTE — Anesthesia Preprocedure Evaluation (Signed)
Anesthesia Evaluation  Patient identified by MRN, date of birth, ID band Patient awake    Reviewed: Allergy & Precautions, NPO status , Patient's Chart, lab work & pertinent test results  Airway Mallampati: II  TM Distance: >3 FB Neck ROM: Full    Dental no notable dental hx. (+) Edentulous Upper, Edentulous Lower   Pulmonary COPD, Current Smoker,    Pulmonary exam normal breath sounds clear to auscultation       Cardiovascular hypertension, Pt. on medications Normal cardiovascular exam Rhythm:Regular Rate:Normal  Wolf Parkinson White   Neuro/Psych negative neurological ROS  negative psych ROS   GI/Hepatic negative GI ROS, Neg liver ROS,   Endo/Other  negative endocrine ROS  Renal/GU negative Renal ROS  negative genitourinary   Musculoskeletal negative musculoskeletal ROS (+)   Abdominal   Peds negative pediatric ROS (+)  Hematology negative hematology ROS (+)   Anesthesia Other Findings   Reproductive/Obstetrics negative OB ROS                             Anesthesia Physical Anesthesia Plan  ASA: II  Anesthesia Plan: Spinal   Post-op Pain Management:    Induction:   Airway Management Planned: Simple Face Mask  Additional Equipment:   Intra-op Plan:   Post-operative Plan:   Informed Consent: I have reviewed the patients History and Physical, chart, labs and discussed the procedure including the risks, benefits and alternatives for the proposed anesthesia with the patient or authorized representative who has indicated his/her understanding and acceptance.   Dental advisory given  Plan Discussed with: CRNA  Anesthesia Plan Comments:         Anesthesia Quick Evaluation

## 2015-02-11 NOTE — Transfer of Care (Signed)
Immediate Anesthesia Transfer of Care Note  Patient: David Anderson  Procedure(s) Performed: Procedure(s): TOTAL HIP ARTHROPLASTY ANTERIOR APPROACH (Left)  Patient Location: PACU  Anesthesia Type:Spinal  Level of Consciousness:  sedated, patient cooperative and responds to stimulation  Airway & Oxygen Therapy:Patient Spontanous Breathing and Patient connected to face mask oxgen  Post-op Assessment:  Report given to PACU RN and Post -op Vital signs reviewed and stable  Post vital signs:  Reviewed and stable  Last Vitals:  Filed Vitals:   02/11/15 1156  BP: 150/78  Pulse: 85  Temp: 36.8 C  Resp: 18    Complications: No apparent anesthesia complications

## 2015-02-11 NOTE — Anesthesia Procedure Notes (Signed)
Spinal Patient location during procedure: OR Start time: 02/11/2015 2:37 PM End time: 02/11/2015 2:47 PM Staffing Anesthesiologist: Montez Hageman Resident/CRNA: Darlys Gales R Performed by: anesthesiologist and resident/CRNA  Preanesthetic Checklist Completed: patient identified, site marked, surgical consent, pre-op evaluation, timeout performed, IV checked, risks and benefits discussed and monitors and equipment checked Spinal Block Patient position: sitting Prep: Betadine Patient monitoring: heart rate, continuous pulse ox and blood pressure Approach: midline Location: L3-4 Injection technique: single-shot Needle Needle type: Spinocan  Needle gauge: 22 G Needle length: 9 cm Needle insertion depth: 7.5 cm Assessment Sensory level: T6 Additional Notes Expiration date of kit checked and confirmed. Attempt x 1 CRNA unsuccessful. Second attempt by anesthesiologist, CSF +, aspiration before and after injection. Patient tolerated procedure well, without complications. Lot [id number] Exp2018-02-28.

## 2015-02-12 ENCOUNTER — Encounter (HOSPITAL_COMMUNITY): Payer: Self-pay | Admitting: Orthopedic Surgery

## 2015-02-12 LAB — BASIC METABOLIC PANEL
Anion gap: 7 (ref 5–15)
BUN: 15 mg/dL (ref 6–20)
CALCIUM: 8.9 mg/dL (ref 8.9–10.3)
CO2: 32 mmol/L (ref 22–32)
Chloride: 98 mmol/L — ABNORMAL LOW (ref 101–111)
Creatinine, Ser: 0.76 mg/dL (ref 0.61–1.24)
GFR calc Af Amer: >60 mL/min (ref >60)
GLUCOSE: 192 mg/dL — AB (ref 65–99)
Potassium: 3.9 mmol/L (ref 3.5–5.1)
Sodium: 137 mmol/L (ref 135–145)

## 2015-02-12 LAB — CBC
HCT: 39.4 % (ref 39.0–52.0)
Hemoglobin: 13.8 g/dL (ref 13.0–17.0)
MCH: 32 pg (ref 26.0–34.0)
MCHC: 35 g/dL (ref 30.0–36.0)
MCV: 91.4 fL (ref 78.0–100.0)
PLATELETS: 182 10*3/uL (ref 150–400)
RBC: 4.31 MIL/uL (ref 4.22–5.81)
RDW: 12.4 % (ref 11.5–15.5)
WBC: 14.9 10*3/uL — ABNORMAL HIGH (ref 4.0–10.5)

## 2015-02-12 MED ORDER — OXYCODONE HCL 5 MG PO TABS: 5.0000 mg | ORAL_TABLET | ORAL | Status: DC | PRN

## 2015-02-12 MED ORDER — RIVAROXABAN 10 MG PO TABS: 10.0000 mg | ORAL_TABLET | Freq: Every day | ORAL | Status: DC

## 2015-02-12 MED ORDER — METHOCARBAMOL 500 MG PO TABS: 500.0000 mg | ORAL_TABLET | Freq: Four times a day (QID) | ORAL | Status: DC | PRN

## 2015-02-12 MED ORDER — TRAMADOL HCL 50 MG PO TABS: 50.0000 mg | ORAL_TABLET | Freq: Four times a day (QID) | ORAL | Status: DC | PRN

## 2015-02-12 NOTE — Progress Notes (Signed)
Physical Therapy Treatment Note    02/12/15 1504  PT Visit Information  Last PT Received On 02/12/15  Assistance Needed +1  History of Present Illness Pt is a 59 year old male s/p L direct anterior THA with hx of COPD  PT Time Calculation  PT Start Time (ACUTE ONLY) 1429  PT Stop Time (ACUTE ONLY) 1441  PT Time Calculation (min) (ACUTE ONLY) 12 min  Subjective Data  Subjective Pt assisted into bathroom and then ambulated in hallway.  Precautions  Precautions Fall  Precaution Comments direct anterior approach  Restrictions  Other Position/Activity Restrictions WBAT  Pain Assessment  Pain Assessment 0-10  Pain Score 4  Pain Location L hip  Pain Descriptors / Indicators Sore;Tightness  Pain Intervention(s) Limited activity within patient's tolerance;Monitored during session;Ice applied  Cognition  Arousal/Alertness Awake/alert  Behavior During Therapy WFL for tasks assessed/performed  Overall Cognitive Status Within Functional Limits for tasks assessed  Bed Mobility  General bed mobility comments pt up in recliner  Transfers  Overall transfer level Needs assistance  Equipment used Rolling walker (2 wheeled)  Transfers Sit to/from Stand  Sit to Stand Min guard  General transfer comment verbal cues for safe technique  Ambulation/Gait  Ambulation/Gait assistance Min guard  Ambulation Distance (Feet) 160 Feet  Assistive device Rolling walker (2 wheeled)  Gait Pattern/deviations Step-through pattern;Decreased step length - right;Decreased stance time - left;Antalgic  General Gait Details verbal cues for posture, RW positioning and step length, encouraged shorter steps and slower pace for safety  PT - End of Session  Activity Tolerance Patient tolerated treatment well  Patient left in chair;with call bell/phone within reach;with chair alarm set;with family/visitor present  PT - Assessment/Plan  PT Plan Current plan remains appropriate  PT Frequency (ACUTE ONLY) 7X/week  Follow  Up Recommendations Home health PT  PT equipment None recommended by PT  PT Goal Progression  Progress towards PT goals Progressing toward goals  PT General Charges  $$ ACUTE PT VISIT 1 Procedure  PT Treatments  $Gait Training 8-22 mins    Carmelia Bake, PT, DPT 02/12/2015 Pager: [phone]

## 2015-02-12 NOTE — Progress Notes (Signed)
   Subjective: 1 Day Post-Op Procedure(s) (LRB): TOTAL HIP ARTHROPLASTY ANTERIOR APPROACH (Left) Patient reports pain as mild.   Patient seen in rounds with Dr. Wynelle Link.   Tough night but better this morning. Family in room. Patient is well, but has had some minor complaints of pain in the hip, requiring pain medications We will start therapy today.  Plan is to go Home after hospital stay.  Objective: Vital signs in last 24 hours: Temp:  [97.2 F (36.2 C)-98.4 F (36.9 C)] 97.5 F (36.4 C) (11/15 0613) Pulse Rate:  [69-85] 78 (11/15 0613) Resp:  [14-21] 14 (11/15 0613) BP: (104-150)/(55-78) 132/78 mmHg (11/15 0613) SpO2:  [95 %-100 %] 99 % (11/15 0613) Weight:  [94.972 kg (209 lb 6 oz)-95 kg (209 lb 7 oz)] 95 kg (209 lb 7 oz) (11/14 1748)  Intake/Output from previous day:  Intake/Output Summary (Last 24 hours) at 02/12/15 0914 Last data filed at 02/12/15 0911  Gross per 24 hour  Intake 2476.67 ml  Output   5670 ml  Net -3193.33 ml    Intake/Output this shift: Total I/O In: -  Out: 1000 [Urine:1000]  Labs:  Recent Labs  02/12/15 0450  HGB 13.8    Recent Labs  02/12/15 0450  WBC 14.9*  RBC 4.31  HCT 39.4  PLT 182    Recent Labs  02/12/15 0450  NA 137  K 3.9  CL 98*  CO2 32  BUN 15  CREATININE 0.76  GLUCOSE 192*  CALCIUM 8.9   No results for input(s): LABPT, INR in the last 72 hours.  EXAM General - Patient is Alert, Appropriate and Oriented Extremity - Neurovascular intact Sensation intact distally Dorsiflexion/Plantar flexion intact Dressing - dressing C/D/I Motor Function - intact, moving foot and toes well on exam.  Hemovac pulled without difficulty.  Past Medical History  Diagnosis Date  . Hypertension   . COPD (chronic obstructive pulmonary disease) ([place])   . Arthritis     oa  . Shortness of breath dyspnea     with exertion  . Pneumonia years ago    walking pneumonia  . History of blood transfusion 1973     Assessment/Plan: 1 Day Post-Op Procedure(s) (LRB): TOTAL HIP ARTHROPLASTY ANTERIOR APPROACH (Left) Principal Problem:   OA (osteoarthritis) of hip  Estimated body mass index is 30.91 kg/(m^2) as calculated from the following:   Height as of this encounter: 5\' 9"  (1.753 m).   Weight as of this encounter: 95 kg (209 lb 7 oz). Advance diet Up with therapy Plan for discharge tomorrow Discharge home with home health  DVT Prophylaxis - Xarelto Weight Bearing As Tolerated left Leg Hemovac Pulled Begin Therapy  Arlee Muslim, PA-C Orthopaedic Surgery 02/12/2015, 9:14 AM

## 2015-02-12 NOTE — Discharge Instructions (Addendum)
° °Dr. Frank Aluisio °Total Joint Specialist °[facility] °[address] °[place], [place] [address] °[phone] ° °ANTERIOR APPROACH TOTAL HIP REPLACEMENT POSTOPERATIVE DIRECTIONS ° ° °Hip Rehabilitation, Guidelines Following Surgery  °The results of a hip operation are greatly improved after range of motion and muscle strengthening exercises. Follow all safety measures which are given to protect your hip. If any of these exercises cause increased pain or swelling in your joint, decrease the amount until you are comfortable again. Then slowly increase the exercises. Call your caregiver if you have problems or questions.  ° °HOME CARE INSTRUCTIONS  °Remove items at home which could result in a fall. This includes throw rugs or furniture in walking pathways.  °· ICE to the affected hip every three hours for 30 minutes at a time and then as needed for pain and swelling.  Continue to use ice on the hip for pain and swelling from surgery. You may notice swelling that will progress down to the foot and ankle.  This is normal after surgery.  Elevate the leg when you are not up walking on it.   °· Continue to use the breathing machine which will help keep your temperature down.  It is common for your temperature to cycle up and down following surgery, especially at night when you are not up moving around and exerting yourself.  The breathing machine keeps your lungs expanded and your temperature down. ° ° °DIET °You may resume your previous home diet once your are discharged from the hospital. ° °DRESSING / WOUND CARE / SHOWERING °You may shower 3 days after surgery, but keep the wounds dry during showering.  You may use an occlusive plastic wrap (Press'n Seal for example), NO SOAKING/SUBMERGING IN THE BATHTUB.  If the bandage gets wet, change with a clean dry gauze.  If the incision gets wet, pat the wound dry with a clean towel. °You may start showering once you are discharged home but do not  submerge the incision under water. Just pat the incision dry and apply a dry gauze dressing on daily. °Change the surgical dressing daily and reapply a dry dressing each time. ° °ACTIVITY °Walk with your walker as instructed. °Use walker as long as suggested by your caregivers. °Avoid periods of inactivity such as sitting longer than an hour when not asleep. This helps prevent blood clots.  °You may resume a sexual relationship in one month or when given the OK by your doctor.  °You may return to work once you are cleared by your doctor.  °Do not drive a car for 6 weeks or until released by you surgeon.  °Do not drive while taking narcotics. ° °WEIGHT BEARING °Weight bearing as tolerated with assist device (walker, cane, etc) as directed, use it as long as suggested by your surgeon or therapist, typically at least 4-6 weeks. ° °POSTOPERATIVE CONSTIPATION PROTOCOL °Constipation - defined medically as fewer than three stools per week and severe constipation as less than one stool per week. ° °One of the most common issues patients have following surgery is constipation.  Even if you have a regular bowel pattern at home, your normal regimen is likely to be disrupted due to multiple reasons following surgery.  Combination of anesthesia, postoperative narcotics, change in appetite and fluid intake all can affect your bowels.  In order to avoid complications following surgery, here are some recommendations in order to help you during your recovery period. ° °Colace (docusate) - Pick up an over-the-counter   form of Colace or another stool softener and take twice a day as long as you are requiring postoperative pain medications.  Take with a full glass of water daily.  If you experience loose stools or diarrhea, hold the colace until you stool forms back up.  If your symptoms do not get better within 1 week or if they get worse, check with your doctor. ° °Dulcolax (bisacodyl) - Pick up over-the-counter and take as directed  by the product packaging as needed to assist with the movement of your bowels.  Take with a full glass of water.  Use this product as needed if not relieved by Colace only.  ° °MiraLax (polyethylene glycol) - Pick up over-the-counter to have on hand.  MiraLax is a solution that will increase the amount of water in your bowels to assist with bowel movements.  Take as directed and can mix with a glass of water, juice, soda, coffee, or tea.  Take if you go more than two days without a movement. °Do not use MiraLax more than once per day. Call your doctor if you are still constipated or irregular after using this medication for 7 days in a row. ° °If you continue to have problems with postoperative constipation, please contact the office for further assistance and recommendations.  If you experience "the worst abdominal pain ever" or develop nausea or vomiting, please contact the office immediatly for further recommendations for treatment. ° °ITCHING ° If you experience itching with your medications, try taking only a single pain pill, or even half a pain pill at a time.  You can also use Benadryl over the counter for itching or also to help with sleep.  ° °TED HOSE STOCKINGS °Wear the elastic stockings on both legs for three weeks following surgery during the day but you may remove then at night for sleeping. ° °MEDICATIONS °See your medication summary on the “After Visit Summary” that the nursing staff will review with you prior to discharge.  You may have some home medications which will be placed on hold until you complete the course of blood thinner medication.  It is important for you to complete the blood thinner medication as prescribed by your surgeon.  Continue your approved medications as instructed at time of discharge. ° °PRECAUTIONS °If you experience chest pain or shortness of breath - call 911 immediately for transfer to the hospital emergency department.  °If you develop a fever greater that 101 F,  purulent drainage from wound, increased redness or drainage from wound, foul odor from the wound/dressing, or calf pain - CONTACT YOUR SURGEON.   °                                                °FOLLOW-UP APPOINTMENTS °Make sure you keep all of your appointments after your operation with your surgeon and caregivers. You should call the office at the above phone number and make an appointment for approximately two weeks after the date of your surgery or on the date instructed by your surgeon outlined in the "After Visit Summary". ° °RANGE OF MOTION AND STRENGTHENING EXERCISES  °These exercises are designed to help you keep full movement of your hip joint. Follow your caregiver's or physical therapist's instructions. Perform all exercises about fifteen times, three times per day or as directed. Exercise both hips, even if you   have had only one joint replacement. These exercises can be done on a training (exercise) mat, on the floor, on a table or on a bed. Use whatever works the best and is most comfortable for you. Use music or television while you are exercising so that the exercises are a pleasant break in your day. This will make your life better with the exercises acting as a break in routine you can look forward to.  Lying on your back, slowly slide your foot toward your buttocks, raising your knee up off the floor. Then slowly slide your foot back down until your leg is straight again.  Lying on your back spread your legs as far apart as you can without causing discomfort.  Lying on your side, raise your upper leg and foot straight up from the floor as far as is comfortable. Slowly lower the leg and repeat.  Lying on your back, tighten up the muscle in the front of your thigh (quadriceps muscles). You can do this by keeping your leg straight and trying to raise your heel off the floor. This helps strengthen the largest muscle supporting your knee.  Lying on your back, tighten up the muscles of your  buttocks both with the legs straight and with the knee bent at a comfortable angle while keeping your heel on the floor.   IF YOU ARE TRANSFERRED TO A SKILLED REHAB FACILITY If the patient is transferred to a skilled rehab facility following release from the hospital, a list of the current medications will be sent to the facility for the patient to continue.  When discharged from the skilled rehab facility, please have the facility set up the patient's [place] prior to being released. Also, the skilled facility will be responsible for providing the patient with their medications at time of release from the facility to include their pain medication, the muscle relaxants, and their blood thinner medication. If the patient is still at the rehab facility at time of the two week follow up appointment, the skilled rehab facility will also need to assist the patient in arranging follow up appointment in our office and any transportation needs.  MAKE SURE YOU:  Understand these instructions.  Get help right away if you are not doing well or get worse.    Pick up stool softner and laxative for home use following surgery while on pain medications. Do not submerge incision under water. Please use good hand washing techniques while changing dressing each day. May shower starting three days after surgery. Please use a clean towel to pat the incision dry following showers. Continue to use ice for pain and swelling after surgery. Do not use any lotions or creams on the incision until instructed by your surgeon.  Take Xarelto for two and a half more weeks, then discontinue Xarelto. Once the patient has completed the blood thinner regimen, then take a Baby 81 mg Aspirin daily for three more weeks.   Information on my medicine - XARELTO (Rivaroxaban)  This medication education was reviewed with me or my healthcare representative as part of my discharge preparation.  The pharmacist that  spoke with me during my hospital stay was:  Luiz Ochoa [facility]  Why was Xarelto prescribed for you? Xarelto was prescribed for you to reduce the risk of blood clots forming after orthopedic surgery. The medical term for these abnormal blood clots is venous thromboembolism (VTE).  What do you need to know about xarelto ? Take your Xarelto  DAILY at the same time every day. °You may take it either with or without food. ° °If you have difficulty swallowing the tablet whole, you may crush it and mix in applesauce just prior to taking your dose. ° °Take Xarelto® exactly as prescribed by your doctor and DO NOT stop taking Xarelto® without talking to the doctor who prescribed the medication.  Stopping without other VTE prevention medication to take the place of Xarelto® may increase your risk of developing a clot. ° °After discharge, you should have regular check-up appointments with your healthcare provider that is prescribing your Xarelto®.   ° °What do you do if you miss a dose? °If you miss a dose, take it as soon as you remember on the same day then continue your regularly scheduled once daily regimen the next day. Do not take two doses of Xarelto® on the same day.  ° °Important Safety Information °A possible side effect of Xarelto® is bleeding. You should call your healthcare provider right away if you experience any of the following: °? Bleeding from an injury or your nose that does not stop. °? Unusual colored urine (red or dark brown) or unusual colored stools (red or black). °? Unusual bruising for unknown reasons. °? A serious fall or if you hit your head (even if there is no bleeding). ° °Some medicines may interact with Xarelto® and might increase your risk of bleeding while on Xarelto®. To help avoid this, consult your healthcare provider or pharmacist prior to using any new prescription or non-prescription medications, including herbals, vitamins, non-steroidal anti-inflammatory drugs  (NSAIDs) and supplements. ° °This website has more information on Xarelto®: www.xarelto.com. ° ° ° ° °

## 2015-02-12 NOTE — Evaluation (Signed)
Physical Therapy Evaluation Patient Details Name: David Anderson MRN: [id number] DOB: [birth year] Today's Date: 02/12/2015   History of Present Illness  Pt is a 59 year old male s/p L direct anterior THA with hx of COPD  Clinical Impression  Pt is s/p L THA resulting in the deficits listed below (see PT Problem List).  Pt will benefit from skilled PT to increase their independence and safety with mobility to allow discharge to the venue listed below.  Pt mobilizing well POD #1 and plans to d/c home.  Pt has a flight of steps to enter however states he can always d/c to his mother's home which has no steps if necessary.       Follow Up Recommendations Home health PT    Equipment Recommendations  None recommended by PT    Recommendations for Other Services       Precautions / Restrictions Precautions Precautions: Fall Precaution Comments: direct anterior approach Restrictions Weight Bearing Restrictions: No Other Position/Activity Restrictions: WBAT      Mobility  Bed Mobility Overal bed mobility: Needs Assistance Bed Mobility: Supine to Sit     Supine to sit: Min guard;HOB elevated     General bed mobility comments: verbal cues for self assist  Transfers Overall transfer level: Needs assistance Equipment used: Rolling walker (2 wheeled) Transfers: Sit to/from Stand Sit to Stand: Min guard;From elevated surface         General transfer comment: verbal cues for safe technique  Ambulation/Gait Ambulation/Gait assistance: Min guard Ambulation Distance (Feet): 120 Feet Assistive device: Rolling walker (2 wheeled) Gait Pattern/deviations: Step-to pattern;Step-through pattern;Antalgic     General Gait Details: verbal cues for sequence, posture, RW positioning and step length, able to progress to step through pattern  Stairs            Wheelchair Mobility    Modified Rankin (Stroke Patients Only)       Balance                                             Pertinent Vitals/Pain Pain Assessment: 0-10 Pain Score: 4  Pain Location: L hip Pain Descriptors / Indicators: Sore Pain Intervention(s): Limited activity within patient's tolerance;Monitored during session;Premedicated before session;Repositioned;Ice applied    Home Living Family/patient expects to be discharged to:: Private residence Living Arrangements: Spouse/significant other Available Help at Discharge: Family Type of Home: Apartment Home Access: Stairs to enter Entrance Stairs-Rails: Chemical engineer of Steps: [place]: One level Home Equipment: Environmental consultant - 2 wheels;Wheelchair - Regulatory affairs officer - single point;Crutches      Prior Function Level of Independence: Independent with assistive device(s)         Comments: was using SPC prior to surgery     Hand Dominance        Extremity/Trunk Assessment               Lower Extremity Assessment: LLE deficits/detail   LLE Deficits / Details: functional hip weakness observed, requiring assist for hip flexion and hip abduction supine     Communication   Communication: No difficulties  Cognition Arousal/Alertness: Awake/alert Behavior During Therapy: WFL for tasks assessed/performed Overall Cognitive Status: Within Functional Limits for tasks assessed                      General Comments      Exercises  Total Joint Exercises Ankle Circles/Pumps: AROM;Both;10 reps Quad Sets: AROM;Both;10 reps Short Arc Quad: AROM;Left;10 reps Heel Slides: AAROM;Left;10 reps Hip ABduction/ADduction: AAROM;Left;10 reps      Assessment/Plan    PT Assessment Patient needs continued PT services  PT Diagnosis Difficulty walking   PT Problem List Decreased strength;Decreased mobility;Decreased knowledge of use of DME;Pain  PT Treatment Interventions Functional mobility training;Stair training;Gait training;DME instruction;Patient/family education;Therapeutic  activities;Therapeutic exercise   PT Goals (Current goals can be found in the Care Plan section) Acute Rehab PT Goals PT Goal Formulation: With patient Time For Goal Achievement: 02/16/15 Potential to Achieve Goals: Good    Frequency 7X/week   Barriers to discharge        Co-evaluation               End of Session Equipment Utilized During Treatment: Gait belt Activity Tolerance: Patient tolerated treatment well Patient left: in chair;with call bell/phone within reach;with family/visitor present;with chair alarm set           Time: 0950-1010 PT Time Calculation (min) (ACUTE ONLY): 20 min   Charges:   PT Evaluation $Initial PT Evaluation Tier I: 1 Procedure     PT G Codes:        Basia Mcginty,KATHrine E 02/12/2015, 12:46 PM Carmelia Bake, PT, DPT 02/12/2015 Pager: [phone]

## 2015-02-12 NOTE — Evaluation (Signed)
Occupational Therapy Evaluation Patient Details Name: David Anderson MRN: [id number] DOB: 08/10/1955 Today's Date: 02/12/2015    History of Present Illness Pt is a 59 year old male s/p L direct anterior THA with hx of COPD   Clinical Impression   Pt was admitted for the above surgery.  All education was completed with pt and his wife.  No further OT is needed at this time    Follow Up Recommendations  No OT follow up;Supervision/Assistance - 24 hour    Equipment Recommendations  3 in 1 bedside comode    Recommendations for Other Services       Precautions / Restrictions Precautions Precautions: Fall Precaution Comments: direct anterior approach Restrictions Other Position/Activity Restrictions: WBAT      Mobility Bed Mobility Overal bed mobility: Needs Assistance Bed Mobility: Supine to Sit          General bed mobility comments: oob  Transfers Overall transfer level: Needs assistance Equipment used: Rolling walker (2 wheeled) Transfers: Sit to/from Stand Sit to Stand: Min guard;From elevated surface from recliner; min A to control descent and get up from comfort height commode with walker for support         General transfer comment: verbal cues for safe technique    Balance                                            ADL Overall ADL's : Needs assistance/impaired                         Toilet Transfer: Ambulation;Comfort height toilet;Minimal assistance             General ADL Comments: pt has h/o back pain.  He does not have AE, but wife will assist as needed.  Pt needs mod A for LB bathing and max A for LB dressing.  He can perform UB adls with set up.  Educated on shower stall sequence, if he decides to use mother's shower.  He may just wash with a pan of water.  Pt would benefit from 3:1 commode over his toilet for increased safety and independence     Vision     Perception     Praxis      Pertinent  Vitals/Pain Pain Score: 5  Pain Location: L hip Pain Descriptors / Indicators: Sore Pain Intervention(s): Limited activity within patient's tolerance;Monitored during session;Premedicated before session;Repositioned;Ice applied     Hand Dominance     Extremity/Trunk Assessment Upper Extremity Assessment Upper Extremity Assessment: Overall WFL for tasks assessed (h/o RCR/tear L; also problem with R but WFLS)          Communication Communication Communication: No difficulties   Cognition Arousal/Alertness: Awake/alert Behavior During Therapy: WFL for tasks assessed/performed Overall Cognitive Status: Within Functional Limits for tasks assessed                     General Comments       Exercises       Shoulder Instructions      Home Living                                   Additional Comments: can use mother's shower; can stay there if desired  Prior Functioning/Environment Level of Independence: Independent with assistive device(s)        Comments: was using SPC prior to surgery    OT Diagnosis: Generalized weakness   OT Problem List:     OT Treatment/Interventions:      OT Goals(Current goals can be found in the care plan section) Acute Rehab OT Goals Patient Stated Goal: get back to being independent  OT Frequency:     Barriers to D/C:            Co-evaluation              End of Session    Activity Tolerance: Patient tolerated treatment well Patient left: in chair;with call bell/phone within reach;with chair alarm set;with family/visitor present   Time: [id number] OT Time Calculation (min): 44 min Charges:  OT General Charges $OT Visit: 1 Procedure OT Evaluation $Initial OT Evaluation Tier I: 1 Procedure OT Treatments $Self Care/Home Management : 8-22 mins G-Codes:    Chrishonda Hesch 02-25-2015, 2:38 PM   Lesle Chris, OTR/L [phone] 25-Feb-2015

## 2015-02-12 NOTE — Care Management Note (Signed)
Case Management Note  Patient Details  Name: David Anderson MRN: [id number] Date of Birth: 1955/05/03  Subjective/Objective:                  TOTAL HIP ARTHROPLASTY ANTERIOR APPROACH (Left) Action/Plan: Discharge planning Expected Discharge Date:  02/13/15               Expected Discharge Plan:  [name]  In-House Referral:     Discharge planning Services  CM Consult  Post Acute Care Choice:  Home Health Choice offered to:  Patient  DME Arranged:  N/A DME Agency:  NA  HH Arranged:  PT HH Agency:  [place]  Status of Service:     Medicare Important Message Given:    Date Medicare IM Given:    Medicare IM give by:    Date Additional Medicare IM Given:    Additional Medicare Important Message give by:     If discussed at [place] of Stay Meetings, dates discussed:    Additional Comments: CM met with pt in room to offer choice of home health agency.  Pt chooses [facility] to render HHPT.  Pt has DME from past surgery and has no additional DME needs.  Referral emailed to [employer], [name].  No other CM needs were communicated. Dellie Catholic, RN 02/12/2015, 1:32 PM

## 2015-02-12 NOTE — Discharge Summary (Signed)
Physician Discharge Summary   [name] MRN: [id number] DOB/AGE: [birth year] 59 y.o.  Admit date: 02/11/2015 Discharge date: 02/13/2015  Primary Diagnosis:  Osteoarthritis of the Left hip.   Admission Diagnoses:  Past Medical History  Diagnosis Date  . Hypertension   . COPD (chronic obstructive pulmonary disease) ([place])   . Arthritis     oa  . Shortness of breath dyspnea     with exertion  . Pneumonia years ago    walking pneumonia  . History of blood transfusion 1973   Discharge Diagnoses:   Principal Problem:   OA (osteoarthritis) of hip  Estimated body mass index is 30.91 kg/(m^2) as calculated from the following:   Height as of this encounter: _0  (1.753 m).   Weight as of this encounter: 95 kg (209 lb 7 oz).  Procedure(s) (LRB): TOTAL HIP ARTHROPLASTY ANTERIOR APPROACH (Left)   Consults: None  HPI: David Anderson is a 59 y.o. male who has advanced end-  stage arthritis of his Left hip with progressively worsening pain and  dysfunction.The patient has failed nonoperative management and presents for  total hip arthroplasty.   Laboratory Data: Admission on 02/11/2015  Component Date Value Ref Range Status  . WBC 02/12/2015 14.9* 4.0 - 10.5 K/uL Final  . RBC 02/12/2015 4.31  4.22 - 5.81 MIL/uL Final  . Hemoglobin 02/12/2015 13.8  13.0 - 17.0 g/dL Final  . HCT 02/12/2015 39.4  39.0 - 52.0 % Final  . MCV 02/12/2015 91.4  78.0 - 100.0 fL Final  . MCH 02/12/2015 32.0  26.0 - 34.0 pg Final  . MCHC 02/12/2015 35.0  30.0 - 36.0 g/dL Final  . RDW 02/12/2015 12.4  11.5 - 15.5 % Final  . Platelets 02/12/2015 182  150 - 400 K/uL Final  . Sodium 02/12/2015 137  135 - 145 mmol/L Final  . Potassium 02/12/2015 3.9  3.5 - 5.1 mmol/L Final  . Chloride 02/12/2015 98* 101 - 111 mmol/L Final  . CO2 02/12/2015 32  22 - 32 mmol/L Final  . Glucose, Bld 02/12/2015 192* 65 - 99 mg/dL Final  . BUN 02/12/2015 15  6 - 20 mg/dL Final  . Creatinine, Ser 02/12/2015  0.76  0.61 - 1.24 mg/dL Final  . Calcium 02/12/2015 8.9  8.9 - 10.3 mg/dL Final  . GFR calc non Af Amer 02/12/2015 >60  >60 mL/min Final  . GFR calc Af Amer 02/12/2015 >60  >60 mL/min Final   Comment: (NOTE) The eGFR has been calculated using the CKD EPI equation. This calculation has not been validated in all clinical situations. eGFR's persistently <60 mL/min signify possible Chronic Kidney Disease.   Georgiann Hahn gap 02/12/2015 7  5 - 15 Final  Hospital Outpatient Visit on 01/30/2015  Component Date Value Ref Range Status  . aPTT 01/30/2015 35  24 - 37 seconds Final  . WBC 01/30/2015 8.0  4.0 - 10.5 K/uL Final  . RBC 01/30/2015 4.88  4.22 - 5.81 MIL/uL Final  . Hemoglobin 01/30/2015 15.7  13.0 - 17.0 g/dL Final  . HCT 01/30/2015 45.5  39.0 - 52.0 % Final  . MCV 01/30/2015 93.2  78.0 - 100.0 fL Final  . MCH 01/30/2015 32.2  26.0 - 34.0 pg Final  . MCHC 01/30/2015 34.5  30.0 - 36.0 g/dL Final  . RDW 01/30/2015 12.8  11.5 - 15.5 % Final  . Platelets 01/30/2015 191  150 - 400 K/uL Final  . Sodium 01/30/2015 136  135 - 145  mmol/L Final  . Potassium 01/30/2015 4.5  3.5 - 5.1 mmol/L Final  . Chloride 01/30/2015 99* 101 - 111 mmol/L Final  . CO2 01/30/2015 29  22 - 32 mmol/L Final  . Glucose, Bld 01/30/2015 107* 65 - 99 mg/dL Final  . BUN 01/30/2015 22* 6 - 20 mg/dL Final  . Creatinine, Ser 01/30/2015 1.09  0.61 - 1.24 mg/dL Final  . Calcium 01/30/2015 9.6  8.9 - 10.3 mg/dL Final  . Total Protein 01/30/2015 7.1  6.5 - 8.1 g/dL Final  . Albumin 01/30/2015 4.4  3.5 - 5.0 g/dL Final  . AST 01/30/2015 41  15 - 41 U/L Final  . ALT 01/30/2015 51  17 - 63 U/L Final  . Alkaline Phosphatase 01/30/2015 49  38 - 126 U/L Final  . Total Bilirubin 01/30/2015 0.6  0.3 - 1.2 mg/dL Final  . GFR calc non Af Amer 01/30/2015 >60  >60 mL/min Final  . GFR calc Af Amer 01/30/2015 >60  >60 mL/min Final   Comment: (NOTE) The eGFR has been calculated using the CKD EPI equation. This calculation has not been  validated in all clinical situations. eGFR's persistently <60 mL/min signify possible Chronic Kidney Disease.   . Anion gap 01/30/2015 8  5 - 15 Final  . Prothrombin Time 01/30/2015 13.9  11.6 - 15.2 seconds Final  . INR 01/30/2015 1.05  0.00 - 1.49 Final  . ABO/RH(D) 01/30/2015 A POS   Final  . Antibody Screen 01/30/2015 NEG   Final  . Sample Expiration 01/30/2015 02/13/2015   Final  . Extend sample reason 01/30/2015 NO TRANSFUSIONS OR PREGNANCY IN THE PAST 3 MONTHS   Final  . Color, Urine 01/30/2015 YELLOW  YELLOW Final  . APPearance 01/30/2015 CLEAR  CLEAR Final  . Specific Gravity, Urine 01/30/2015 1.020  1.005 - 1.030 Final  . pH 01/30/2015 6.0  5.0 - 8.0 Final  . Glucose, UA 01/30/2015 NEGATIVE  NEGATIVE mg/dL Final  . Hgb urine dipstick 01/30/2015 NEGATIVE  NEGATIVE Final  . Bilirubin Urine 01/30/2015 NEGATIVE  NEGATIVE Final  . Ketones, ur 01/30/2015 NEGATIVE  NEGATIVE mg/dL Final  . Protein, ur 01/30/2015 NEGATIVE  NEGATIVE mg/dL Final  . Urobilinogen, UA 01/30/2015 0.2  0.0 - 1.0 mg/dL Final  . Nitrite 01/30/2015 NEGATIVE  NEGATIVE Final  . Leukocytes, UA 01/30/2015 NEGATIVE  NEGATIVE Final   MICROSCOPIC NOT DONE ON URINES WITH NEGATIVE PROTEIN, BLOOD, LEUKOCYTES, NITRITE, OR GLUCOSE <1000 mg/dL.  Marland Kitchen MRSA, PCR 01/30/2015 NEGATIVE  NEGATIVE Final  . Staphylococcus aureus 01/30/2015 NEGATIVE  NEGATIVE Final   Comment:        The Xpert SA Assay (FDA approved for NASAL specimens in patients over 75 years of age), is one component of a comprehensive surveillance program.  Test performance has been validated by [facility] for patients greater than or equal to 32 year old. It is not intended to diagnose infection nor to guide or monitor treatment.   . ABO/RH(D) 01/30/2015 A POS   Final     X-Rays:Dg Pelvis Portable  02/11/2015  CLINICAL DATA:  Post op: Total LEFT hip replacement EXAM: PORTABLE PELVIS 1-2 VIEWS COMPARISON:  None. FINDINGS: Patient has undergone left  total hip arthroplasty. The native left acetabulum appears ill-defined, consistent with prior fracture or infection. A mass is not entirely excluded. Correlation with previous imaging studies recommended. There is no evidence for acute fracture or subluxation of the hardware. There is degenerative change within the right hip. IMPRESSION: Left total hip arthroplasty. Electronically Signed  By: Nolon Nations M.D.   On: 02/11/2015 17:10   Dg C-arm 1-60 Min-no Report  02/11/2015  CLINICAL DATA: hip pain C-ARM 1-60 MINUTES Fluoroscopy was utilized by the requesting physician.  No radiographic interpretation.    EKG: Orders placed or performed during the hospital encounter of 01/30/15  . EKG 12-Lead  . EKG 12-Lead     Hospital Course: Patient was admitted to [facility] and taken to the OR and underwent the above state procedure without complications.  Patient tolerated the procedure well and was later transferred to the recovery room and then to the orthopaedic floor for postoperative care.  They were given PO and IV analgesics for pain control following their surgery.  They were given 24 hours of postoperative antibiotics of  Anti-infectives    Start     Dose/Rate Route Frequency Ordered Stop   02/12/15 0600  ceFAZolin (ANCEF) IVPB 2 g/50 mL premix  Status:  Discontinued     2 g 100 mL/hr over 30 Minutes Intravenous On call to O.R. 02/11/15 1201 02/11/15 1203   02/11/15 2100  ceFAZolin (ANCEF) IVPB 2 g/50 mL premix     2 g 100 mL/hr over 30 Minutes Intravenous Every 6 hours 02/11/15 1752 02/12/15 0431   02/11/15 1215  ceFAZolin (ANCEF) IVPB 2 g/50 mL premix     2 g 100 mL/hr over 30 Minutes Intravenous On call to O.R. 02/11/15 1203 02/11/15 1450     and started on DVT prophylaxis in the form of Xarelto.   PT and OT were ordered for total hip protocol.  The patient was allowed to be WBAT with therapy. Discharge planning was consulted to help with postop disposition and equipment  needs.  Patient had a tough night on the evening of surgery.  They started to get up OOB with therapy on day one.  Hemovac drain was pulled without difficulty.  Continued to work with therapy into day two.  Dressing was changed on day two and the incision was healing well. Patient was seen in rounds on day two and was ready to go home.  Discharge home with home health Diet - Cardiac diet Follow up - in 2 weeks Activity - WBAT Disposition - Home Condition Upon Discharge - Good D/C Meds - See DC Summary DVT Prophylaxis - Xarelto   Discharge Instructions    Call MD / Call 911    Complete by:  As directed   If you experience chest pain or shortness of breath, CALL 911 and be transported to the hospital emergency room.  If you develope a fever above 101 F, pus (white drainage) or increased drainage or redness at the wound, or calf pain, call your surgeon's office.     Change dressing    Complete by:  As directed   You may change your dressing dressing daily with sterile 4 x 4 inch gauze dressing and paper tape.  Do not submerge the incision under water.     Constipation Prevention    Complete by:  As directed   Drink plenty of fluids.  Prune juice may be helpful.  You may use a stool softener, such as Colace (over the counter) 100 mg twice a day.  Use MiraLax (over the counter) for constipation as needed.     Diet - low sodium heart healthy    Complete by:  As directed      Discharge instructions    Complete by:  As directed   Pick up stool  softner and laxative for home use following surgery while on pain medications. Do not submerge incision under water. Please use good hand washing techniques while changing dressing each day. May shower starting three days after surgery. Please use a clean towel to pat the incision dry following showers. Continue to use ice for pain and swelling after surgery. Do not use any lotions or creams on the incision until instructed by your surgeon.  Total Hip  Protocol.  Take Xarelto for two and a half more weeks, then discontinue Xarelto. Once the patient has completed the blood thinner regimen, then take a Baby 81 mg Aspirin daily for three more weeks.  Postoperative Constipation Protocol  Constipation - defined medically as fewer than three stools per week and severe constipation as less than one stool per week.  One of the most common issues patients have following surgery is constipation.  Even if you have a regular bowel pattern at home, your normal regimen is likely to be disrupted due to multiple reasons following surgery.  Combination of anesthesia, postoperative narcotics, change in appetite and fluid intake all can affect your bowels.  In order to avoid complications following surgery, here are some recommendations in order to help you during your recovery period.  Colace (docusate) - Pick up an over-the-counter form of Colace or another stool softener and take twice a day as long as you are requiring postoperative pain medications.  Take with a full glass of water daily.  If you experience loose stools or diarrhea, hold the colace until you stool forms back up.  If your symptoms do not get better within 1 week or if they get worse, check with your doctor.  Dulcolax (bisacodyl) - Pick up over-the-counter and take as directed by the product packaging as needed to assist with the movement of your bowels.  Take with a full glass of water.  Use this product as needed if not relieved by Colace only.   MiraLax (polyethylene glycol) - Pick up over-the-counter to have on hand.  MiraLax is a solution that will increase the amount of water in your bowels to assist with bowel movements.  Take as directed and can mix with a glass of water, juice, soda, coffee, or tea.  Take if you go more than two days without a movement. Do not use MiraLax more than once per day. Call your doctor if you are still constipated or irregular after using this medication for 7  days in a row.  If you continue to have problems with postoperative constipation, please contact the office for further assistance and recommendations.  If you experience "the worst abdominal pain ever" or develop nausea or vomiting, please contact the office immediatly for further recommendations for treatment.     Do not sit on low chairs, stoools or toilet seats, as it may be difficult to get up from low surfaces    Complete by:  As directed      Driving restrictions    Complete by:  As directed   No driving until released by the physician.     Increase activity slowly as tolerated    Complete by:  As directed      Lifting restrictions    Complete by:  As directed   No lifting until released by the physician.     Patient may shower    Complete by:  As directed   You may shower without a dressing once there is no drainage.  Do not wash over  the wound.  If drainage remains, do not shower until drainage stops.     TED hose    Complete by:  As directed   Use stockings (TED hose) for 3 weeks on both leg(s).  You may remove them at night for sleeping.     Weight bearing as tolerated    Complete by:  As directed   Laterality:  left  Extremity:  Lower            Medication List    STOP taking these medications        ACAI BERRY PO     ibuprofen 600 MG tablet  Commonly known as:  ADVIL,MOTRIN      TAKE these medications        ADVAIR DISKUS 250-50 MCG/DOSE Aepb  Generic drug:  Fluticasone-Salmeterol  Inhale 1 puff into the lungs 2 (two) times daily.     chlorthalidone 25 MG tablet  Commonly known as:  HYGROTON  Take 25 mg by mouth every morning.     COMBIVENT RESPIMAT IN  Inhale 1 puff into the lungs every 6 (six) hours as needed (for pain).     fluticasone 50 MCG/ACT nasal spray  Commonly known as:  FLONASE  Place into both nostrils daily.     methocarbamol 500 MG tablet  Commonly known as:  ROBAXIN  Take 1 tablet (500 mg total) by mouth every 6 (six) hours as  needed for muscle spasms.     NASAL SPRAY NA  Place into the nose as needed.     oxyCODONE 5 MG immediate release tablet  Commonly known as:  Oxy IR/ROXICODONE  Take 1-2 tablets (5-10 mg total) by mouth every 3 (three) hours as needed for breakthrough pain.     Potassium 99 MG Tabs  Take 1 tablet by mouth daily.     pravastatin 80 MG tablet  Commonly known as:  PRAVACHOL  Take 80 mg by mouth at bedtime.     rivaroxaban 10 MG Tabs tablet  Commonly known as:  XARELTO  Take 1 tablet (10 mg total) by mouth daily with breakfast. Take Xarelto for two and a half more weeks, then discontinue Xarelto. Once the patient has completed the blood thinner regimen, then take a Baby 81 mg Aspirin daily for three more weeks.     tiotropium 18 MCG inhalation capsule  Commonly known as:  SPIRIVA  Place 18 mcg into inhaler and inhale daily.     traMADol 50 MG tablet  Commonly known as:  ULTRAM  Take 1-2 tablets (50-100 mg total) by mouth every 6 (six) hours as needed (mild pain).     traZODone 50 MG tablet  Commonly known as:  DESYREL  Take 50 mg by mouth at bedtime.           Follow-up Information    Follow up with [facility].   Why:  home health physical therapy   Contact information:   [address] [place] [address] [phone]       Follow up with [name], MD. Schedule an appointment as soon as possible for a visit on 02/26/2015.   Specialty:  Orthopedic Surgery   Why:  Call office at [phone] to setup appointment with Dr. Wynelle Link on tuesday 02/26/2015.   Contact information:   [address] [place] [address] [phone]       Signed: Arlee Muslim, PA-C Orthopaedic Surgery 02/12/2015, 9:34 PM

## 2015-02-13 LAB — BASIC METABOLIC PANEL
ANION GAP: 8 (ref 5–15)
BUN: 16 mg/dL (ref 6–20)
CHLORIDE: 99 mmol/L — AB (ref 101–111)
CO2: 31 mmol/L (ref 22–32)
Calcium: 8.7 mg/dL — ABNORMAL LOW (ref 8.9–10.3)
Creatinine, Ser: 0.79 mg/dL (ref 0.61–1.24)
GFR calc Af Amer: >60 mL/min (ref >60)
GLUCOSE: 175 mg/dL — AB (ref 65–99)
POTASSIUM: 3.1 mmol/L — AB (ref 3.5–5.1)
Sodium: 138 mmol/L (ref 135–145)

## 2015-02-13 LAB — CBC
HEMATOCRIT: 35.2 % — AB (ref 39.0–52.0)
HEMOGLOBIN: 12.3 g/dL — AB (ref 13.0–17.0)
MCH: 31.5 pg (ref 26.0–34.0)
MCHC: 34.9 g/dL (ref 30.0–36.0)
MCV: 90.3 fL (ref 78.0–100.0)
Platelets: 145 10*3/uL — ABNORMAL LOW (ref 150–400)
RBC: 3.9 MIL/uL — AB (ref 4.22–5.81)
RDW: 12.5 % (ref 11.5–15.5)
WBC: 14.7 10*3/uL — AB (ref 4.0–10.5)

## 2015-02-13 NOTE — Progress Notes (Signed)
   Subjective: 2 Days Post-Op Procedure(s) (LRB): TOTAL HIP ARTHROPLASTY ANTERIOR APPROACH (Left) Patient reports pain as mild.   Patient seen in rounds with Dr. Wynelle Link. Patient is well, and has had no acute complaints or problems Patient is ready to go home  Objective: Vital signs in last 24 hours: Temp:  [97.2 F (36.2 C)-98.3 F (36.8 C)] 97.2 F (36.2 C) (11/15 2202) Pulse Rate:  [70-80] 80 (11/16 0428) Resp:  [16] 16 (11/16 0428) BP: (118-140)/(68-74) 135/74 mmHg (11/16 0428) SpO2:  [96 %-99 %] 96 % (11/16 0428)  Intake/Output from previous day:  Intake/Output Summary (Last 24 hours) at 02/13/15 0808 Last data filed at 02/13/15 0427  Gross per 24 hour  Intake   1180 ml  Output   2301 ml  Net  -1121 ml    Labs:  Recent Labs  02/12/15 0450 02/13/15 0455  HGB 13.8 12.3*    Recent Labs  02/12/15 0450 02/13/15 0455  WBC 14.9* 14.7*  RBC 4.31 3.90*  HCT 39.4 35.2*  PLT 182 145*    Recent Labs  02/12/15 0450 02/13/15 0455  NA 137 138  K 3.9 3.1*  CL 98* 99*  CO2 32 31  BUN 15 16  CREATININE 0.76 0.79  GLUCOSE 192* 175*  CALCIUM 8.9 8.7*   No results for input(s): LABPT, INR in the last 72 hours.  EXAM: General - Patient is Alert and Appropriate Extremity - Neurovascular intact Sensation intact distally Incision - clean, dry Motor Function - intact, moving foot and toes well on exam.   Assessment/Plan: 2 Days Post-Op Procedure(s) (LRB): TOTAL HIP ARTHROPLASTY ANTERIOR APPROACH (Left) Procedure(s) (LRB): TOTAL HIP ARTHROPLASTY ANTERIOR APPROACH (Left) Past Medical History  Diagnosis Date  . Hypertension   . COPD (chronic obstructive pulmonary disease) ([place])   . Arthritis     oa  . Shortness of breath dyspnea     with exertion  . Pneumonia years ago    walking pneumonia  . History of blood transfusion 1973   Principal Problem:   OA (osteoarthritis) of hip  Estimated body mass index is 30.91 kg/(m^2) as calculated from the  following:   Height as of this encounter: 5\' 9"  (1.753 m).   Weight as of this encounter: 95 kg (209 lb 7 oz). Up with therapy Discharge home with home health Diet - Cardiac diet Follow up - in 2 weeks Activity - WBAT Disposition - Home Condition Upon Discharge - Good D/C Meds - See DC Summary DVT Prophylaxis - Xarelto  Arlee Muslim, PA-C Orthopaedic Surgery 02/13/2015, 8:08 AM

## 2015-02-13 NOTE — Progress Notes (Signed)
RN notified Cm pt needs a 3n1.  Cm called [facility] DME rep, Lecretia to please deliver the 3n1 to room so pt can discharge.  No other CM needs were communicated.

## 2015-02-13 NOTE — Progress Notes (Signed)
Physical Therapy Treatment Patient Details Name: David Anderson MRN: [id number] DOB: 1955/07/15 Today's Date: 02/13/2015    History of Present Illness Pt is a 59 year old male s/p L direct anterior THA with hx of COPD    PT Comments    Patient required min guard assist for navigation of LLE for supine>sit and min guard assist for stabilization. Pt. Ambulated ~300+ ft in hallway requiring 50% VC to not push the walker so far out in front of him, take smaller steps and to slow down as patients gait velocity is unsafe; required min assist to negotiate steps and 50% VC for correct placement of caregiver for assistance (as caregiver/girlfriend was not present to instruct); pt. Modified independent with HEP requiring AAROM for heel slides and hip aBduction/aDdcution exercises (10x each)    Follow Up Recommendations  Home health PT     Equipment Recommendations  3in1 (PT) (pt. stated this is the only thing he does not have at home )    Recommendations for Other Services       Precautions / Restrictions Precautions Precautions: Fall Precaution Comments: direct anterior approach Restrictions Weight Bearing Restrictions: No Other Position/Activity Restrictions: WBAT    Mobility  Bed Mobility Overal bed mobility: Needs Assistance Bed Mobility: Supine to Sit     Supine to sit: Min guard;HOB elevated        Transfers Overall transfer level: Needs assistance Equipment used: Rolling walker (2 wheeled) Transfers: Sit to/from Stand Sit to Stand: Min guard;Supervision         General transfer comment: 25% VC for proper hand placement   Ambulation/Gait Ambulation/Gait assistance: Min guard;Supervision Ambulation Distance (Feet): 300 Feet Assistive device: Rolling walker (2 wheeled)   Gait velocity: quick   General Gait Details: 50% VC for shorter steps and slower pace for safety and to not push walker far out in front   Stairs Stairs: Yes Stairs assistance: Min  assist Stair Management: Two rails;Step to pattern;Forwards;With crutches (not close enough to reach both; uses R rail going up and R rail going down (switches sides so rail is always on R; uses can in L at home) ) Number of Stairs: 4 General stair comments: pt. instructed and requiring min assist for safety + 50% VC for proper placement of caregiver for assisting him down stairs; requires increased time  Wheelchair Mobility    Modified Rankin (Stroke Patients Only)       Balance                                    Cognition Arousal/Alertness: Awake/alert Behavior During Therapy: WFL for tasks assessed/performed Overall Cognitive Status: Within Functional Limits for tasks assessed                      Exercises Total Joint Exercises Ankle Circles/Pumps: AROM;Both;10 reps Quad Sets: AROM;10 reps;Left Short Arc Quad: AROM;Left;10 reps Heel Slides: AAROM;Left;10 reps Hip ABduction/ADduction: AAROM;Left;10 reps    General Comments        Pertinent Vitals/Pain Pain Assessment: 0-10 Pain Score: 2  Pain Location: L hip  Pain Descriptors / Indicators: Sore Pain Intervention(s): Limited activity within patient's tolerance;Monitored during session;Premedicated before session;Ice applied;Repositioned    Home Living                      Prior Function  PT Goals (current goals can now be found in the care plan section) Acute Rehab PT Goals Time For Goal Achievement: 02/16/15 Potential to Achieve Goals: Good Progress towards PT goals: Progressing toward goals    Frequency  7X/week    PT Plan Current plan remains appropriate    Co-evaluation             End of Session Equipment Utilized During Treatment: Gait belt Activity Tolerance: Patient tolerated treatment well Patient left: in chair;with call bell/phone within reach;with chair alarm set     Time: [id number] PT Time Calculation (min) (ACUTE ONLY): 33  min  Charges:  $Gait Training: 8-22 mins $Therapeutic Activity: 8-22 mins                    G CodesDenna Haggard, SPTA   02/13/2015 12:53 PM   Pager: [phone]   Reviewed and agree with above Rica Koyanagi  PTA [facility]  Acute  Rehab Pager      [phone]

## 2015-07-25 DIAGNOSIS — E785 Hyperlipidemia, unspecified: Secondary | ICD-10-CM | POA: Diagnosis not present

## 2015-07-29 DIAGNOSIS — G4709 Other insomnia: Secondary | ICD-10-CM | POA: Diagnosis not present

## 2015-07-29 DIAGNOSIS — J301 Allergic rhinitis due to pollen: Secondary | ICD-10-CM | POA: Diagnosis not present

## 2015-07-29 DIAGNOSIS — J449 Chronic obstructive pulmonary disease, unspecified: Secondary | ICD-10-CM | POA: Diagnosis not present

## 2015-07-29 DIAGNOSIS — F5221 Male erectile disorder: Secondary | ICD-10-CM | POA: Diagnosis not present

## 2015-07-29 DIAGNOSIS — S43409A Unspecified sprain of unspecified shoulder joint, initial encounter: Secondary | ICD-10-CM | POA: Diagnosis not present

## 2015-07-29 DIAGNOSIS — M13852 Other specified arthritis, left hip: Secondary | ICD-10-CM | POA: Diagnosis not present

## 2015-07-29 DIAGNOSIS — I10 Essential (primary) hypertension: Secondary | ICD-10-CM | POA: Diagnosis not present

## 2015-07-29 DIAGNOSIS — E785 Hyperlipidemia, unspecified: Secondary | ICD-10-CM | POA: Diagnosis not present

## 2015-07-29 DIAGNOSIS — B351 Tinea unguium: Secondary | ICD-10-CM | POA: Diagnosis not present

## 2015-07-29 DIAGNOSIS — F17200 Nicotine dependence, unspecified, uncomplicated: Secondary | ICD-10-CM | POA: Diagnosis not present

## 2015-07-29 DIAGNOSIS — L309 Dermatitis, unspecified: Secondary | ICD-10-CM | POA: Diagnosis not present

## 2015-09-20 DIAGNOSIS — L309 Dermatitis, unspecified: Secondary | ICD-10-CM | POA: Diagnosis not present

## 2015-11-01 DIAGNOSIS — L309 Dermatitis, unspecified: Secondary | ICD-10-CM | POA: Diagnosis not present

## 2015-12-04 DIAGNOSIS — E785 Hyperlipidemia, unspecified: Secondary | ICD-10-CM | POA: Diagnosis not present

## 2015-12-06 DIAGNOSIS — B351 Tinea unguium: Secondary | ICD-10-CM | POA: Diagnosis not present

## 2015-12-06 DIAGNOSIS — G4709 Other insomnia: Secondary | ICD-10-CM | POA: Diagnosis not present

## 2015-12-06 DIAGNOSIS — J449 Chronic obstructive pulmonary disease, unspecified: Secondary | ICD-10-CM | POA: Diagnosis not present

## 2015-12-06 DIAGNOSIS — F17200 Nicotine dependence, unspecified, uncomplicated: Secondary | ICD-10-CM | POA: Diagnosis not present

## 2015-12-06 DIAGNOSIS — E785 Hyperlipidemia, unspecified: Secondary | ICD-10-CM | POA: Diagnosis not present

## 2015-12-06 DIAGNOSIS — I10 Essential (primary) hypertension: Secondary | ICD-10-CM | POA: Diagnosis not present

## 2015-12-06 DIAGNOSIS — M13852 Other specified arthritis, left hip: Secondary | ICD-10-CM | POA: Diagnosis not present

## 2015-12-06 DIAGNOSIS — L309 Dermatitis, unspecified: Secondary | ICD-10-CM | POA: Diagnosis not present

## 2015-12-06 DIAGNOSIS — F5221 Male erectile disorder: Secondary | ICD-10-CM | POA: Diagnosis not present

## 2015-12-06 DIAGNOSIS — J301 Allergic rhinitis due to pollen: Secondary | ICD-10-CM | POA: Diagnosis not present

## 2015-12-06 DIAGNOSIS — S43409A Unspecified sprain of unspecified shoulder joint, initial encounter: Secondary | ICD-10-CM | POA: Diagnosis not present

## 2016-03-06 DIAGNOSIS — I10 Essential (primary) hypertension: Secondary | ICD-10-CM | POA: Diagnosis not present

## 2016-03-06 DIAGNOSIS — E785 Hyperlipidemia, unspecified: Secondary | ICD-10-CM | POA: Diagnosis not present

## 2016-03-06 DIAGNOSIS — N3941 Urge incontinence: Secondary | ICD-10-CM | POA: Diagnosis not present

## 2016-03-24 DIAGNOSIS — R634 Abnormal weight loss: Secondary | ICD-10-CM | POA: Diagnosis not present

## 2016-03-24 DIAGNOSIS — M13852 Other specified arthritis, left hip: Secondary | ICD-10-CM | POA: Diagnosis not present

## 2016-03-24 DIAGNOSIS — F17200 Nicotine dependence, unspecified, uncomplicated: Secondary | ICD-10-CM | POA: Diagnosis not present

## 2016-03-24 DIAGNOSIS — J301 Allergic rhinitis due to pollen: Secondary | ICD-10-CM | POA: Diagnosis not present

## 2016-03-24 DIAGNOSIS — E1165 Type 2 diabetes mellitus with hyperglycemia: Secondary | ICD-10-CM | POA: Diagnosis not present

## 2016-03-24 DIAGNOSIS — J449 Chronic obstructive pulmonary disease, unspecified: Secondary | ICD-10-CM | POA: Diagnosis not present

## 2016-03-24 DIAGNOSIS — I10 Essential (primary) hypertension: Secondary | ICD-10-CM | POA: Diagnosis not present

## 2016-03-24 DIAGNOSIS — F5221 Male erectile disorder: Secondary | ICD-10-CM | POA: Diagnosis not present

## 2016-03-24 DIAGNOSIS — G4709 Other insomnia: Secondary | ICD-10-CM | POA: Diagnosis not present

## 2016-03-24 DIAGNOSIS — L309 Dermatitis, unspecified: Secondary | ICD-10-CM | POA: Diagnosis not present

## 2016-03-24 DIAGNOSIS — E785 Hyperlipidemia, unspecified: Secondary | ICD-10-CM | POA: Diagnosis not present

## 2016-03-24 DIAGNOSIS — B351 Tinea unguium: Secondary | ICD-10-CM | POA: Diagnosis not present

## 2016-03-26 DIAGNOSIS — R634 Abnormal weight loss: Secondary | ICD-10-CM | POA: Diagnosis not present

## 2016-04-07 DIAGNOSIS — J449 Chronic obstructive pulmonary disease, unspecified: Secondary | ICD-10-CM | POA: Diagnosis not present

## 2016-04-07 DIAGNOSIS — B351 Tinea unguium: Secondary | ICD-10-CM | POA: Diagnosis not present

## 2016-04-07 DIAGNOSIS — M13852 Other specified arthritis, left hip: Secondary | ICD-10-CM | POA: Diagnosis not present

## 2016-04-07 DIAGNOSIS — L309 Dermatitis, unspecified: Secondary | ICD-10-CM | POA: Diagnosis not present

## 2016-04-07 DIAGNOSIS — F5221 Male erectile disorder: Secondary | ICD-10-CM | POA: Diagnosis not present

## 2016-04-07 DIAGNOSIS — J301 Allergic rhinitis due to pollen: Secondary | ICD-10-CM | POA: Diagnosis not present

## 2016-04-07 DIAGNOSIS — F17200 Nicotine dependence, unspecified, uncomplicated: Secondary | ICD-10-CM | POA: Diagnosis not present

## 2016-04-07 DIAGNOSIS — I10 Essential (primary) hypertension: Secondary | ICD-10-CM | POA: Diagnosis not present

## 2016-04-07 DIAGNOSIS — E1165 Type 2 diabetes mellitus with hyperglycemia: Secondary | ICD-10-CM | POA: Diagnosis not present

## 2016-04-07 DIAGNOSIS — R634 Abnormal weight loss: Secondary | ICD-10-CM | POA: Diagnosis not present

## 2016-04-07 DIAGNOSIS — E785 Hyperlipidemia, unspecified: Secondary | ICD-10-CM | POA: Diagnosis not present

## 2016-04-07 DIAGNOSIS — G4709 Other insomnia: Secondary | ICD-10-CM | POA: Diagnosis not present

## 2016-04-10 DIAGNOSIS — Z1211 Encounter for screening for malignant neoplasm of colon: Secondary | ICD-10-CM | POA: Diagnosis not present

## 2016-04-20 ENCOUNTER — Encounter: Payer: PPO | Attending: Internal Medicine | Admitting: Dietician

## 2016-04-20 ENCOUNTER — Encounter: Payer: Self-pay | Admitting: Dietician

## 2016-04-20 VITALS — BP 104/60 | Ht 69.0 in | Wt 168.1 lb

## 2016-04-20 DIAGNOSIS — E119 Type 2 diabetes mellitus without complications: Secondary | ICD-10-CM | POA: Diagnosis not present

## 2016-04-20 DIAGNOSIS — Z713 Dietary counseling and surveillance: Secondary | ICD-10-CM | POA: Insufficient documentation

## 2016-04-20 NOTE — Progress Notes (Signed)
Diabetes Self-Management Education  Visit Type: First/Initial  Appt. Start Time:1030 Appt. End Time: [id number]  04/20/2016  [name], identified by name and date of birth, is a 61 y.o. male with a diagnosis of Diabetes: Type 2.   ASSESSMENT  Blood pressure 104/60, height 5\' 9"  (1.753 m), weight 168 lb 1.6 oz (76.2 kg). Body mass index is 24.82 kg/m. Lacks knowledge of diabetes care Smokes 1 ppd (pt is trying to quit-taking Chantix) c/o blurred vision that has worsened recently     Diabetes Self-Management Education - 04/20/16 1232      Visit Information   Visit Type First/Initial     Initial Visit   Diabetes Type Type 2     Health Coping   How would you rate your overall health? Good     Psychosocial Assessment   Patient Belief/Attitude about Diabetes Motivated to manage diabetes   Self-care barriers Impaired vision   Patient Concerns Nutrition/Meal planning;Glycemic Control  become more fit; quit smoking   Special Needs Large print   Preferred Learning Style Auditory   Learning Readiness Ready   What is the last grade level you completed in school? 12     Pre-Education Assessment   Patient understands the diabetes disease and treatment process. Needs Instruction   Patient understands incorporating nutritional management into lifestyle. Needs Instruction   Patient undertands incorporating physical activity into lifestyle. Needs Instruction   Patient understands using medications safely. Needs Review   Patient understands monitoring blood glucose, interpreting and using results Needs Review  pt has Ultra One Touch 2 meter and has been checking 2x/day  past wk   Patient understands prevention, detection, and treatment of acute complications. Needs Instruction   Patient understands prevention, detection, and treatment of chronic complications. Needs Instruction  c/o blurred vision that has worsened more recently    Patient understands how to develop strategies to  address psychosocial issues. Needs Instruction   Patient understands how to develop strategies to promote health/change behavior. Needs Instruction     Complications   Last HgB A1C per patient/outside source 14 %  (03-06-16)   How often do you check your blood sugar? 1-2 times/day   Fasting Blood glucose range (mg/dL) 70-129;130-179;>200  FBG=167, 229, 168, 159, 131, 122; ac lunch or supper-160, 114, 215, 204, 125, 120, 262   Have you had a dilated eye exam in the past 12 months? No  2 years ago   Have you had a dental exam in the past 12 months? No  25+ years ago-has no teeth but has upper and lower dentures   Are you checking your feet? Yes   How many days per week are you checking your feet? 1     Dietary Intake   Breakfast --  eats at [place] (morning) --  none   Lunch --  eats at 12p but occasionally skips lunch   Snack (afternoon) --  eats occasional snack at 3p=chips or pudding   Dinner --  eats at 5-6p-eats sweets 4-5x/wk and fried foods 1x/wk   Snack (evening) --  none   Beverage(s) --  drinks 8+ glasses of water/day, 0-1 regular Dr Beverly Gust and 2-3 cups of black coffee/day     Exercise   Exercise Type ADL's     Patient Education   Previous Diabetes Education No   Disease state  Definition of diabetes, type 1 and 2, and the diagnosis of diabetes;Explored patient's options for treatment of their diabetes   Nutrition  management  Role of diet in the treatment of diabetes and the relationship between the three main macronutrients and blood glucose level;Food label reading, portion sizes and measuring food.;Carbohydrate counting   Physical activity and exercise  Role of exercise on diabetes management, blood pressure control and cardiac health.;Helped patient identify appropriate exercises in relation to his/her diabetes, diabetes complications and other health issue.   Medications Reviewed patients medication for diabetes, action, purpose, timing of dose and side  effects.   Monitoring Purpose and frequency of SMBG.;Taught/discussed recording of test results and interpretation of SMBG.;Identified appropriate SMBG and/or A1C goals.;Yearly dilated eye exam   Acute complications Discussed and identified patients' treatment of hyperglycemia.   Chronic complications Relationship between chronic complications and blood glucose control;Retinopathy and reason for yearly dilated eye exams;Reviewed with patient heart disease, higher risk of, and prevention;Nephropathy, what it is, prevention of, the use of ACE, ARB's and early detection of through urine microalbumia.   Psychosocial adjustment Role of stress on diabetes   Personal strategies to promote health Lifestyle issues that need to be addressed for better diabetes care;Helped patient develop diabetes management plan for (enter comment);Review risk of smoking and offered smoking cessation     Outcomes   Expected Outcomes Demonstrated interest in learning. Expect positive outcomes      Individualized Plan for Diabetes Self-Management Training:   Learning Objective:  Patient will have a greater understanding of diabetes self-management. Patient education plan is to attend individual and/or group sessions per assessed needs and concerns.   Plan:   Patient Instructions   Check blood sugars 2 x day before breakfast and 2 hrs after supper every day Exercise:  Walk  for   10-15   minutes   3  days a week Avoid sugar sweetened drinks (soda, tea, coffee, sports drinks, juices) Eat 3 meals day,   1-2  snacks a day-at bedtime + in afternoon if desires Space meals 4-5 hours apart Eat 3-4 carbohydrate servings/meal + protein Eat 1 carbohydrate serving/snack + protein Limit intake of sweets and fried foods Make healthy food choices Quit smoking Make eye doctor appointment once vision clears up Bring blood sugar records to the next appointment/class Get a [place] container Return for appointment/classes on:  04-30-16   Expected Outcomes:  Demonstrated interest in learning. Expect positive outcomes  Education material provided: General Meal Planning Guidelines, Smoking Cessation classes handout  If problems or questions, patient to contact team via:  [phone]  Future DSME appointment:  04-30-16

## 2016-04-20 NOTE — Patient Instructions (Signed)
  Check blood sugars 2 x day before breakfast and 2 hrs after supper every day Exercise:  Walk  for   10-15   minutes   3  days a week Avoid sugar sweetened drinks (soda, tea, coffee, sports drinks, juices) Eat 3 meals day,   1-2  snacks a day-at bedtime + in afternoon if desires Space meals 4-5 hours apart Eat 3-4 carbohydrate servings/meal + protein Eat 1 carbohydrate serving/snack + protein Limit intake of sweets and fried foods Make healthy food choices Quit smoking Make eye doctor appointment once vision clears up Bring blood sugar records to the next appointment/class Get a [place] container Return for appointment/classes on: 04-30-16

## 2016-04-30 ENCOUNTER — Encounter: Payer: Self-pay | Admitting: Dietician

## 2016-04-30 ENCOUNTER — Encounter: Payer: PPO | Attending: Internal Medicine | Admitting: Dietician

## 2016-04-30 VITALS — Wt 173.6 lb

## 2016-04-30 DIAGNOSIS — E119 Type 2 diabetes mellitus without complications: Secondary | ICD-10-CM | POA: Diagnosis not present

## 2016-04-30 DIAGNOSIS — Z713 Dietary counseling and surveillance: Secondary | ICD-10-CM | POA: Insufficient documentation

## 2016-04-30 NOTE — Progress Notes (Signed)

## 2016-05-07 ENCOUNTER — Encounter: Payer: Self-pay | Admitting: *Deleted

## 2016-05-07 ENCOUNTER — Encounter: Payer: PPO | Admitting: *Deleted

## 2016-05-07 VITALS — Wt 170.6 lb

## 2016-05-07 DIAGNOSIS — Z713 Dietary counseling and surveillance: Secondary | ICD-10-CM | POA: Diagnosis not present

## 2016-05-07 DIAGNOSIS — E119 Type 2 diabetes mellitus without complications: Secondary | ICD-10-CM

## 2016-05-07 NOTE — Progress Notes (Signed)
Appt. Start Time: 0900 Appt. End Time: 1200  Class 2 Nutritional Management - identify sources of carbohydrate, protein and fat; plan balanced meals; estimate servings of carbohydrates in meals  Psychosocial - identify DM as a source of stress; state the effects of stress on BG control  Exercise - describe the effects of exercise on blood glucose and importance of regular exercise in controlling diabetes; state a plan for personal exercise; verbalize contraindications for exercise  Self-Monitoring - state importance of SMBG; use SMBG results to effectively manage diabetes; identify importance of regular HbA1C testing and goals for results  Acute Complications - recognize hyperglycemia and hypoglycemia with causes and effects; identify blood glucose results as high, low or in control; list steps in treating and preventing high and low blood glucose  Sick Day Guidelines: state appropriate measure to manage blood glucose when ill (need for meds, HBGM plan, when to call physician, need for fluids)  Chronic Complications/Foot, Skin, Eye Dental Care - identify possible long-term complications of diabetes (retinopathy, neuropathy, nephropathy, cardiovascular disease, infections); explain steps in prevention and treatment of chronic complications; state importance of daily self-foot exams; describe how to examine feet and what to look for; explain appropriate eye and dental care  Lifestyle Changes/Goals - state benefits of making appropriate lifestyle changes; identify habits that need to change (meals, tobacco, alcohol); identify strategies to reduce risk factors for personal health  Pregnancy/Sexual Health - state importance of good blood glucose control in preventing sexual problems (impotence, vaginal dryness, infections, loss of desire)  Teaching Materials Used: Class 2 Slide Packet A1C Pamphlet Foot Care Literature Kidney Test Handout Quick and "Balanced" Meal Ideas Carb Counting and Meal  Planning Book Goals for Class 2 

## 2016-05-14 ENCOUNTER — Encounter: Payer: Self-pay | Admitting: Dietician

## 2016-05-14 ENCOUNTER — Encounter: Payer: PPO | Admitting: Dietician

## 2016-05-14 VITALS — BP 124/72 | Ht 69.0 in | Wt 171.5 lb

## 2016-05-14 DIAGNOSIS — E119 Type 2 diabetes mellitus without complications: Secondary | ICD-10-CM

## 2016-05-14 DIAGNOSIS — Z713 Dietary counseling and surveillance: Secondary | ICD-10-CM | POA: Diagnosis not present

## 2016-05-14 NOTE — Progress Notes (Signed)

## 2016-05-18 ENCOUNTER — Encounter: Payer: Self-pay | Admitting: Dietician

## 2016-06-18 DIAGNOSIS — E785 Hyperlipidemia, unspecified: Secondary | ICD-10-CM | POA: Diagnosis not present

## 2016-06-18 DIAGNOSIS — E119 Type 2 diabetes mellitus without complications: Secondary | ICD-10-CM | POA: Diagnosis not present

## 2016-06-18 DIAGNOSIS — E038 Other specified hypothyroidism: Secondary | ICD-10-CM | POA: Diagnosis not present

## 2016-06-18 DIAGNOSIS — I10 Essential (primary) hypertension: Secondary | ICD-10-CM | POA: Diagnosis not present

## 2016-06-19 DIAGNOSIS — B351 Tinea unguium: Secondary | ICD-10-CM | POA: Diagnosis not present

## 2016-06-19 DIAGNOSIS — F5221 Male erectile disorder: Secondary | ICD-10-CM | POA: Diagnosis not present

## 2016-06-19 DIAGNOSIS — L309 Dermatitis, unspecified: Secondary | ICD-10-CM | POA: Diagnosis not present

## 2016-06-19 DIAGNOSIS — E1165 Type 2 diabetes mellitus with hyperglycemia: Secondary | ICD-10-CM | POA: Diagnosis not present

## 2016-06-19 DIAGNOSIS — I10 Essential (primary) hypertension: Secondary | ICD-10-CM | POA: Diagnosis not present

## 2016-06-19 DIAGNOSIS — E785 Hyperlipidemia, unspecified: Secondary | ICD-10-CM | POA: Diagnosis not present

## 2016-06-19 DIAGNOSIS — J301 Allergic rhinitis due to pollen: Secondary | ICD-10-CM | POA: Diagnosis not present

## 2016-06-19 DIAGNOSIS — F17200 Nicotine dependence, unspecified, uncomplicated: Secondary | ICD-10-CM | POA: Diagnosis not present

## 2016-06-19 DIAGNOSIS — G4709 Other insomnia: Secondary | ICD-10-CM | POA: Diagnosis not present

## 2016-06-19 DIAGNOSIS — J449 Chronic obstructive pulmonary disease, unspecified: Secondary | ICD-10-CM | POA: Diagnosis not present

## 2016-06-19 DIAGNOSIS — M13852 Other specified arthritis, left hip: Secondary | ICD-10-CM | POA: Diagnosis not present

## 2016-08-03 DIAGNOSIS — I10 Essential (primary) hypertension: Secondary | ICD-10-CM | POA: Diagnosis not present

## 2016-08-03 DIAGNOSIS — E1165 Type 2 diabetes mellitus with hyperglycemia: Secondary | ICD-10-CM | POA: Diagnosis not present

## 2016-08-03 DIAGNOSIS — J449 Chronic obstructive pulmonary disease, unspecified: Secondary | ICD-10-CM | POA: Diagnosis not present

## 2016-08-03 DIAGNOSIS — M13852 Other specified arthritis, left hip: Secondary | ICD-10-CM | POA: Diagnosis not present

## 2016-08-03 DIAGNOSIS — B351 Tinea unguium: Secondary | ICD-10-CM | POA: Diagnosis not present

## 2016-08-03 DIAGNOSIS — R634 Abnormal weight loss: Secondary | ICD-10-CM | POA: Diagnosis not present

## 2016-08-03 DIAGNOSIS — F5221 Male erectile disorder: Secondary | ICD-10-CM | POA: Diagnosis not present

## 2016-08-03 DIAGNOSIS — L309 Dermatitis, unspecified: Secondary | ICD-10-CM | POA: Diagnosis not present

## 2016-08-03 DIAGNOSIS — G4709 Other insomnia: Secondary | ICD-10-CM | POA: Diagnosis not present

## 2016-08-03 DIAGNOSIS — F17200 Nicotine dependence, unspecified, uncomplicated: Secondary | ICD-10-CM | POA: Diagnosis not present

## 2016-08-03 DIAGNOSIS — E785 Hyperlipidemia, unspecified: Secondary | ICD-10-CM | POA: Diagnosis not present

## 2016-08-03 DIAGNOSIS — J301 Allergic rhinitis due to pollen: Secondary | ICD-10-CM | POA: Diagnosis not present

## 2016-09-11 DIAGNOSIS — E785 Hyperlipidemia, unspecified: Secondary | ICD-10-CM | POA: Diagnosis not present

## 2016-09-11 DIAGNOSIS — I10 Essential (primary) hypertension: Secondary | ICD-10-CM | POA: Diagnosis not present

## 2016-09-11 DIAGNOSIS — E1165 Type 2 diabetes mellitus with hyperglycemia: Secondary | ICD-10-CM | POA: Diagnosis not present

## 2016-09-14 DIAGNOSIS — J449 Chronic obstructive pulmonary disease, unspecified: Secondary | ICD-10-CM | POA: Diagnosis not present

## 2016-09-14 DIAGNOSIS — L309 Dermatitis, unspecified: Secondary | ICD-10-CM | POA: Diagnosis not present

## 2016-09-14 DIAGNOSIS — B351 Tinea unguium: Secondary | ICD-10-CM | POA: Diagnosis not present

## 2016-09-14 DIAGNOSIS — F5221 Male erectile disorder: Secondary | ICD-10-CM | POA: Diagnosis not present

## 2016-09-14 DIAGNOSIS — E785 Hyperlipidemia, unspecified: Secondary | ICD-10-CM | POA: Diagnosis not present

## 2016-09-14 DIAGNOSIS — G4709 Other insomnia: Secondary | ICD-10-CM | POA: Diagnosis not present

## 2016-09-14 DIAGNOSIS — I10 Essential (primary) hypertension: Secondary | ICD-10-CM | POA: Diagnosis not present

## 2016-09-14 DIAGNOSIS — J301 Allergic rhinitis due to pollen: Secondary | ICD-10-CM | POA: Diagnosis not present

## 2016-09-14 DIAGNOSIS — E1165 Type 2 diabetes mellitus with hyperglycemia: Secondary | ICD-10-CM | POA: Diagnosis not present

## 2016-09-14 DIAGNOSIS — R634 Abnormal weight loss: Secondary | ICD-10-CM | POA: Diagnosis not present

## 2016-09-14 DIAGNOSIS — F17200 Nicotine dependence, unspecified, uncomplicated: Secondary | ICD-10-CM | POA: Diagnosis not present

## 2016-09-14 DIAGNOSIS — M13852 Other specified arthritis, left hip: Secondary | ICD-10-CM | POA: Diagnosis not present

## 2016-10-29 DIAGNOSIS — E119 Type 2 diabetes mellitus without complications: Secondary | ICD-10-CM | POA: Diagnosis not present

## 2016-12-22 DIAGNOSIS — E1165 Type 2 diabetes mellitus with hyperglycemia: Secondary | ICD-10-CM | POA: Diagnosis not present

## 2016-12-22 DIAGNOSIS — E785 Hyperlipidemia, unspecified: Secondary | ICD-10-CM | POA: Diagnosis not present

## 2016-12-23 DIAGNOSIS — R634 Abnormal weight loss: Secondary | ICD-10-CM | POA: Diagnosis not present

## 2016-12-23 DIAGNOSIS — B351 Tinea unguium: Secondary | ICD-10-CM | POA: Diagnosis not present

## 2016-12-23 DIAGNOSIS — M13852 Other specified arthritis, left hip: Secondary | ICD-10-CM | POA: Diagnosis not present

## 2016-12-23 DIAGNOSIS — L309 Dermatitis, unspecified: Secondary | ICD-10-CM | POA: Diagnosis not present

## 2016-12-23 DIAGNOSIS — F17210 Nicotine dependence, cigarettes, uncomplicated: Secondary | ICD-10-CM | POA: Diagnosis not present

## 2016-12-23 DIAGNOSIS — J449 Chronic obstructive pulmonary disease, unspecified: Secondary | ICD-10-CM | POA: Diagnosis not present

## 2016-12-23 DIAGNOSIS — J301 Allergic rhinitis due to pollen: Secondary | ICD-10-CM | POA: Diagnosis not present

## 2016-12-23 DIAGNOSIS — E1165 Type 2 diabetes mellitus with hyperglycemia: Secondary | ICD-10-CM | POA: Diagnosis not present

## 2016-12-23 DIAGNOSIS — I10 Essential (primary) hypertension: Secondary | ICD-10-CM | POA: Diagnosis not present

## 2016-12-23 DIAGNOSIS — I8393 Asymptomatic varicose veins of bilateral lower extremities: Secondary | ICD-10-CM | POA: Diagnosis not present

## 2016-12-23 DIAGNOSIS — E785 Hyperlipidemia, unspecified: Secondary | ICD-10-CM | POA: Diagnosis not present

## 2016-12-23 DIAGNOSIS — F5221 Male erectile disorder: Secondary | ICD-10-CM | POA: Diagnosis not present

## 2017-04-27 DIAGNOSIS — J301 Allergic rhinitis due to pollen: Secondary | ICD-10-CM | POA: Diagnosis not present

## 2017-04-27 DIAGNOSIS — J449 Chronic obstructive pulmonary disease, unspecified: Secondary | ICD-10-CM | POA: Diagnosis not present

## 2017-04-27 DIAGNOSIS — R634 Abnormal weight loss: Secondary | ICD-10-CM | POA: Diagnosis not present

## 2017-04-27 DIAGNOSIS — F5221 Male erectile disorder: Secondary | ICD-10-CM | POA: Diagnosis not present

## 2017-04-27 DIAGNOSIS — E1165 Type 2 diabetes mellitus with hyperglycemia: Secondary | ICD-10-CM | POA: Diagnosis not present

## 2017-04-27 DIAGNOSIS — I8393 Asymptomatic varicose veins of bilateral lower extremities: Secondary | ICD-10-CM | POA: Diagnosis not present

## 2017-04-27 DIAGNOSIS — M13852 Other specified arthritis, left hip: Secondary | ICD-10-CM | POA: Diagnosis not present

## 2017-04-27 DIAGNOSIS — B351 Tinea unguium: Secondary | ICD-10-CM | POA: Diagnosis not present

## 2017-04-27 DIAGNOSIS — I10 Essential (primary) hypertension: Secondary | ICD-10-CM | POA: Diagnosis not present

## 2017-04-27 DIAGNOSIS — J209 Acute bronchitis, unspecified: Secondary | ICD-10-CM | POA: Diagnosis not present

## 2017-04-27 DIAGNOSIS — E785 Hyperlipidemia, unspecified: Secondary | ICD-10-CM | POA: Diagnosis not present

## 2017-04-27 DIAGNOSIS — L309 Dermatitis, unspecified: Secondary | ICD-10-CM | POA: Diagnosis not present

## 2017-05-06 DIAGNOSIS — E1165 Type 2 diabetes mellitus with hyperglycemia: Secondary | ICD-10-CM | POA: Diagnosis not present

## 2017-05-10 DIAGNOSIS — R634 Abnormal weight loss: Secondary | ICD-10-CM | POA: Diagnosis not present

## 2017-05-10 DIAGNOSIS — E1165 Type 2 diabetes mellitus with hyperglycemia: Secondary | ICD-10-CM | POA: Diagnosis not present

## 2017-05-10 DIAGNOSIS — F5221 Male erectile disorder: Secondary | ICD-10-CM | POA: Diagnosis not present

## 2017-05-10 DIAGNOSIS — L309 Dermatitis, unspecified: Secondary | ICD-10-CM | POA: Diagnosis not present

## 2017-05-10 DIAGNOSIS — B351 Tinea unguium: Secondary | ICD-10-CM | POA: Diagnosis not present

## 2017-05-10 DIAGNOSIS — J449 Chronic obstructive pulmonary disease, unspecified: Secondary | ICD-10-CM | POA: Diagnosis not present

## 2017-05-10 DIAGNOSIS — M13852 Other specified arthritis, left hip: Secondary | ICD-10-CM | POA: Diagnosis not present

## 2017-05-10 DIAGNOSIS — F17200 Nicotine dependence, unspecified, uncomplicated: Secondary | ICD-10-CM | POA: Diagnosis not present

## 2017-05-10 DIAGNOSIS — I8393 Asymptomatic varicose veins of bilateral lower extremities: Secondary | ICD-10-CM | POA: Diagnosis not present

## 2017-05-10 DIAGNOSIS — Z23 Encounter for immunization: Secondary | ICD-10-CM | POA: Diagnosis not present

## 2017-05-10 DIAGNOSIS — J301 Allergic rhinitis due to pollen: Secondary | ICD-10-CM | POA: Diagnosis not present

## 2017-05-10 DIAGNOSIS — I10 Essential (primary) hypertension: Secondary | ICD-10-CM | POA: Diagnosis not present

## 2017-05-10 DIAGNOSIS — E785 Hyperlipidemia, unspecified: Secondary | ICD-10-CM | POA: Diagnosis not present

## 2017-08-12 DIAGNOSIS — E1165 Type 2 diabetes mellitus with hyperglycemia: Secondary | ICD-10-CM | POA: Diagnosis not present

## 2017-08-13 DIAGNOSIS — M13852 Other specified arthritis, left hip: Secondary | ICD-10-CM | POA: Diagnosis not present

## 2017-08-13 DIAGNOSIS — J449 Chronic obstructive pulmonary disease, unspecified: Secondary | ICD-10-CM | POA: Diagnosis not present

## 2017-08-13 DIAGNOSIS — F17200 Nicotine dependence, unspecified, uncomplicated: Secondary | ICD-10-CM | POA: Diagnosis not present

## 2017-08-13 DIAGNOSIS — B351 Tinea unguium: Secondary | ICD-10-CM | POA: Diagnosis not present

## 2017-08-13 DIAGNOSIS — E785 Hyperlipidemia, unspecified: Secondary | ICD-10-CM | POA: Diagnosis not present

## 2017-08-13 DIAGNOSIS — I8393 Asymptomatic varicose veins of bilateral lower extremities: Secondary | ICD-10-CM | POA: Diagnosis not present

## 2017-08-13 DIAGNOSIS — I10 Essential (primary) hypertension: Secondary | ICD-10-CM | POA: Diagnosis not present

## 2017-08-13 DIAGNOSIS — J301 Allergic rhinitis due to pollen: Secondary | ICD-10-CM | POA: Diagnosis not present

## 2017-08-13 DIAGNOSIS — F5221 Male erectile disorder: Secondary | ICD-10-CM | POA: Diagnosis not present

## 2017-08-13 DIAGNOSIS — E119 Type 2 diabetes mellitus without complications: Secondary | ICD-10-CM | POA: Diagnosis not present

## 2017-08-13 DIAGNOSIS — L309 Dermatitis, unspecified: Secondary | ICD-10-CM | POA: Diagnosis not present

## 2017-08-13 DIAGNOSIS — E1165 Type 2 diabetes mellitus with hyperglycemia: Secondary | ICD-10-CM | POA: Diagnosis not present

## 2017-11-07 IMAGING — DX DG PORTABLE PELVIS
1 series · 1 of 1 positions shown · non-contrast
Comparison: None.

CLINICAL DATA: Post op: Total LEFT hip replacement

EXAM:
PORTABLE PELVIS 1-2 VIEWS

[pelvis ap]
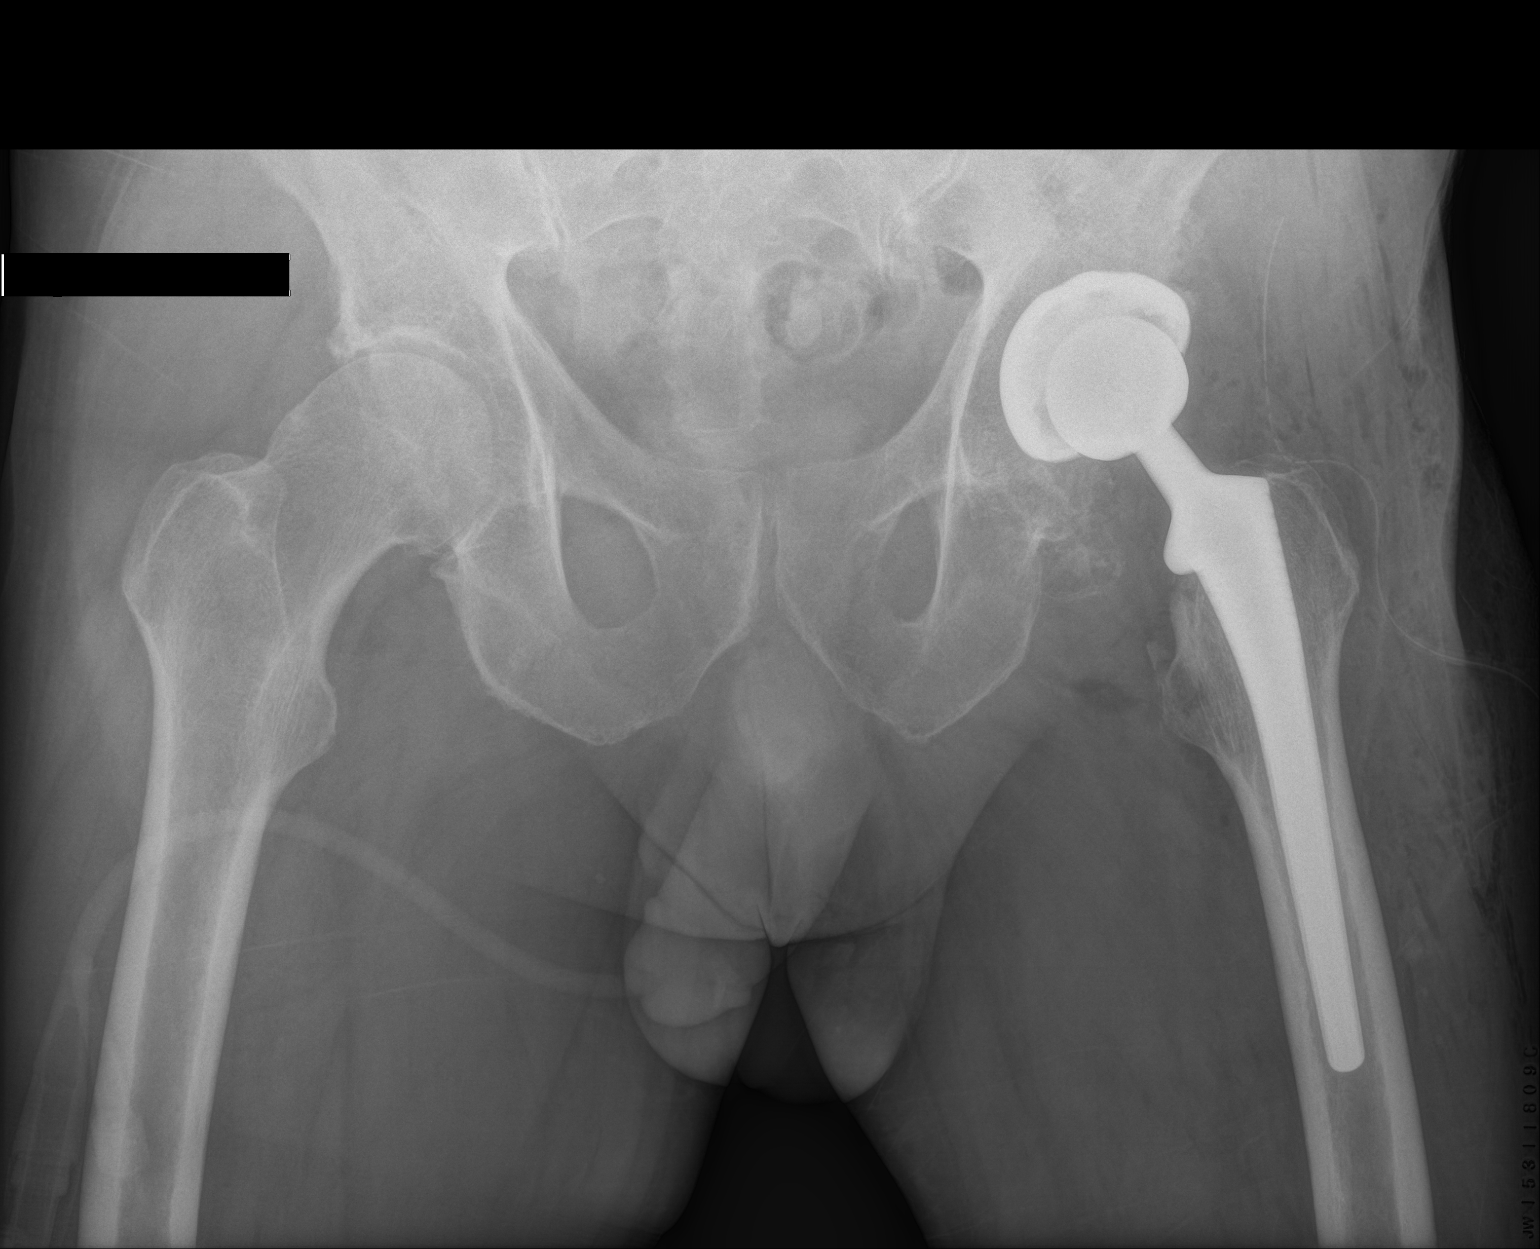

[1 of 1 positions shown; findings below may reference images not displayed]

FINDINGS: Patient has undergone left total hip arthroplasty. The native left
acetabulum appears ill-defined, consistent with prior fracture or
infection. A mass is not entirely excluded. Correlation with
previous imaging studies recommended. There is no evidence for acute
fracture or subluxation of the hardware.

There is degenerative change within the right hip.
IMPRESSION: Left total hip arthroplasty.

## 2017-11-12 DIAGNOSIS — E785 Hyperlipidemia, unspecified: Secondary | ICD-10-CM | POA: Diagnosis not present

## 2017-11-12 DIAGNOSIS — E119 Type 2 diabetes mellitus without complications: Secondary | ICD-10-CM | POA: Diagnosis not present

## 2017-11-15 DIAGNOSIS — F17200 Nicotine dependence, unspecified, uncomplicated: Secondary | ICD-10-CM | POA: Diagnosis not present

## 2017-11-15 DIAGNOSIS — L309 Dermatitis, unspecified: Secondary | ICD-10-CM | POA: Diagnosis not present

## 2017-11-15 DIAGNOSIS — J301 Allergic rhinitis due to pollen: Secondary | ICD-10-CM | POA: Diagnosis not present

## 2017-11-15 DIAGNOSIS — S43409A Unspecified sprain of unspecified shoulder joint, initial encounter: Secondary | ICD-10-CM | POA: Diagnosis not present

## 2017-11-15 DIAGNOSIS — E785 Hyperlipidemia, unspecified: Secondary | ICD-10-CM | POA: Diagnosis not present

## 2017-11-15 DIAGNOSIS — J449 Chronic obstructive pulmonary disease, unspecified: Secondary | ICD-10-CM | POA: Diagnosis not present

## 2017-11-15 DIAGNOSIS — I10 Essential (primary) hypertension: Secondary | ICD-10-CM | POA: Diagnosis not present

## 2017-11-15 DIAGNOSIS — E1165 Type 2 diabetes mellitus with hyperglycemia: Secondary | ICD-10-CM | POA: Diagnosis not present

## 2017-11-15 DIAGNOSIS — E119 Type 2 diabetes mellitus without complications: Secondary | ICD-10-CM | POA: Diagnosis not present

## 2017-11-15 DIAGNOSIS — G4709 Other insomnia: Secondary | ICD-10-CM | POA: Diagnosis not present

## 2017-11-15 DIAGNOSIS — M13852 Other specified arthritis, left hip: Secondary | ICD-10-CM | POA: Diagnosis not present

## 2018-02-21 DIAGNOSIS — E119 Type 2 diabetes mellitus without complications: Secondary | ICD-10-CM | POA: Diagnosis not present

## 2018-02-23 DIAGNOSIS — I10 Essential (primary) hypertension: Secondary | ICD-10-CM | POA: Diagnosis not present

## 2018-02-23 DIAGNOSIS — E1165 Type 2 diabetes mellitus with hyperglycemia: Secondary | ICD-10-CM | POA: Diagnosis not present

## 2018-02-23 DIAGNOSIS — L309 Dermatitis, unspecified: Secondary | ICD-10-CM | POA: Diagnosis not present

## 2018-02-23 DIAGNOSIS — E119 Type 2 diabetes mellitus without complications: Secondary | ICD-10-CM | POA: Diagnosis not present

## 2018-02-23 DIAGNOSIS — Z1211 Encounter for screening for malignant neoplasm of colon: Secondary | ICD-10-CM | POA: Diagnosis not present

## 2018-02-23 DIAGNOSIS — F17200 Nicotine dependence, unspecified, uncomplicated: Secondary | ICD-10-CM | POA: Diagnosis not present

## 2018-02-23 DIAGNOSIS — J301 Allergic rhinitis due to pollen: Secondary | ICD-10-CM | POA: Diagnosis not present

## 2018-02-23 DIAGNOSIS — G4709 Other insomnia: Secondary | ICD-10-CM | POA: Diagnosis not present

## 2018-02-23 DIAGNOSIS — F17210 Nicotine dependence, cigarettes, uncomplicated: Secondary | ICD-10-CM | POA: Diagnosis not present

## 2018-02-23 DIAGNOSIS — J449 Chronic obstructive pulmonary disease, unspecified: Secondary | ICD-10-CM | POA: Diagnosis not present

## 2018-02-23 DIAGNOSIS — E785 Hyperlipidemia, unspecified: Secondary | ICD-10-CM | POA: Diagnosis not present

## 2018-03-15 DIAGNOSIS — I10 Essential (primary) hypertension: Secondary | ICD-10-CM | POA: Diagnosis not present

## 2018-03-15 DIAGNOSIS — G4709 Other insomnia: Secondary | ICD-10-CM | POA: Diagnosis not present

## 2018-03-15 DIAGNOSIS — J449 Chronic obstructive pulmonary disease, unspecified: Secondary | ICD-10-CM | POA: Diagnosis not present

## 2018-03-15 DIAGNOSIS — Z1211 Encounter for screening for malignant neoplasm of colon: Secondary | ICD-10-CM | POA: Diagnosis not present

## 2018-03-15 DIAGNOSIS — F17200 Nicotine dependence, unspecified, uncomplicated: Secondary | ICD-10-CM | POA: Diagnosis not present

## 2018-03-15 DIAGNOSIS — E785 Hyperlipidemia, unspecified: Secondary | ICD-10-CM | POA: Diagnosis not present

## 2018-03-15 DIAGNOSIS — L309 Dermatitis, unspecified: Secondary | ICD-10-CM | POA: Diagnosis not present

## 2018-03-15 DIAGNOSIS — E1165 Type 2 diabetes mellitus with hyperglycemia: Secondary | ICD-10-CM | POA: Diagnosis not present

## 2018-03-15 DIAGNOSIS — J301 Allergic rhinitis due to pollen: Secondary | ICD-10-CM | POA: Diagnosis not present

## 2018-03-15 DIAGNOSIS — E119 Type 2 diabetes mellitus without complications: Secondary | ICD-10-CM | POA: Diagnosis not present

## 2018-04-13 DIAGNOSIS — Z720 Tobacco use: Secondary | ICD-10-CM | POA: Diagnosis not present

## 2018-04-13 DIAGNOSIS — E782 Mixed hyperlipidemia: Secondary | ICD-10-CM | POA: Diagnosis not present

## 2018-04-13 DIAGNOSIS — I10 Essential (primary) hypertension: Secondary | ICD-10-CM | POA: Diagnosis not present

## 2018-04-13 DIAGNOSIS — J449 Chronic obstructive pulmonary disease, unspecified: Secondary | ICD-10-CM | POA: Diagnosis not present

## 2018-04-13 DIAGNOSIS — Z800 Family history of malignant neoplasm of digestive organs: Secondary | ICD-10-CM | POA: Diagnosis not present

## 2018-04-13 DIAGNOSIS — R195 Other fecal abnormalities: Secondary | ICD-10-CM | POA: Diagnosis not present

## 2018-05-30 ENCOUNTER — Encounter: Payer: Self-pay | Admitting: Student

## 2018-05-31 ENCOUNTER — Encounter: Payer: Self-pay | Admitting: Emergency Medicine

## 2018-05-31 ENCOUNTER — Other Ambulatory Visit: Payer: Self-pay

## 2018-05-31 ENCOUNTER — Inpatient Hospital Stay
Admission: EM | Admit: 2018-05-31 | Discharge: 2018-06-04 | DRG: 309 | Disposition: A | Discharge status: Home / Self Care | Payer: PPO | Attending: Internal Medicine | Admitting: Internal Medicine

## 2018-05-31 ENCOUNTER — Ambulatory Visit
Admission: RE | Admit: 2018-05-31 | Discharge: 2018-05-31 | Disposition: A | Discharge status: Still In Hospital | Payer: PPO | Source: Home / Self Care | Attending: Internal Medicine | Admitting: Internal Medicine

## 2018-05-31 ENCOUNTER — Emergency Department: Payer: PPO

## 2018-05-31 ENCOUNTER — Ambulatory Visit: Payer: PPO | Admitting: Anesthesiology

## 2018-05-31 ENCOUNTER — Encounter
Admission: RE | Disposition: A | Discharge status: Still In Hospital | Payer: Self-pay | Source: Home / Self Care | Attending: Internal Medicine

## 2018-05-31 DIAGNOSIS — Z66 Do not resuscitate: Secondary | ICD-10-CM | POA: Diagnosis present

## 2018-05-31 DIAGNOSIS — I491 Atrial premature depolarization: Secondary | ICD-10-CM

## 2018-05-31 DIAGNOSIS — K921 Melena: Secondary | ICD-10-CM | POA: Diagnosis not present

## 2018-05-31 DIAGNOSIS — I4892 Unspecified atrial flutter: Secondary | ICD-10-CM | POA: Insufficient documentation

## 2018-05-31 DIAGNOSIS — J449 Chronic obstructive pulmonary disease, unspecified: Secondary | ICD-10-CM | POA: Diagnosis present

## 2018-05-31 DIAGNOSIS — Z7982 Long term (current) use of aspirin: Secondary | ICD-10-CM | POA: Diagnosis not present

## 2018-05-31 DIAGNOSIS — I499 Cardiac arrhythmia, unspecified: Secondary | ICD-10-CM | POA: Diagnosis not present

## 2018-05-31 DIAGNOSIS — R000 Tachycardia, unspecified: Secondary | ICD-10-CM | POA: Diagnosis not present

## 2018-05-31 DIAGNOSIS — Z7951 Long term (current) use of inhaled steroids: Secondary | ICD-10-CM

## 2018-05-31 DIAGNOSIS — E119 Type 2 diabetes mellitus without complications: Secondary | ICD-10-CM | POA: Diagnosis present

## 2018-05-31 DIAGNOSIS — I10 Essential (primary) hypertension: Secondary | ICD-10-CM | POA: Diagnosis present

## 2018-05-31 DIAGNOSIS — I456 Pre-excitation syndrome: Secondary | ICD-10-CM

## 2018-05-31 DIAGNOSIS — I4891 Unspecified atrial fibrillation: Secondary | ICD-10-CM

## 2018-05-31 DIAGNOSIS — Z539 Procedure and treatment not carried out, unspecified reason: Secondary | ICD-10-CM

## 2018-05-31 DIAGNOSIS — I4819 Other persistent atrial fibrillation: Secondary | ICD-10-CM | POA: Diagnosis present

## 2018-05-31 DIAGNOSIS — F17210 Nicotine dependence, cigarettes, uncomplicated: Secondary | ICD-10-CM | POA: Diagnosis present

## 2018-05-31 DIAGNOSIS — Z96642 Presence of left artificial hip joint: Secondary | ICD-10-CM | POA: Diagnosis not present

## 2018-05-31 DIAGNOSIS — Z79899 Other long term (current) drug therapy: Secondary | ICD-10-CM | POA: Diagnosis not present

## 2018-05-31 DIAGNOSIS — E785 Hyperlipidemia, unspecified: Secondary | ICD-10-CM | POA: Diagnosis not present

## 2018-05-31 DIAGNOSIS — Z01818 Encounter for other preprocedural examination: Secondary | ICD-10-CM | POA: Diagnosis not present

## 2018-05-31 DIAGNOSIS — I229 Subsequent ST elevation (STEMI) myocardial infarction of unspecified site: Secondary | ICD-10-CM | POA: Diagnosis not present

## 2018-05-31 DIAGNOSIS — R002 Palpitations: Secondary | ICD-10-CM | POA: Diagnosis not present

## 2018-05-31 DIAGNOSIS — Z7984 Long term (current) use of oral hypoglycemic drugs: Secondary | ICD-10-CM

## 2018-05-31 DIAGNOSIS — Z01810 Encounter for preprocedural cardiovascular examination: Secondary | ICD-10-CM | POA: Diagnosis not present

## 2018-05-31 HISTORY — PX: COLONOSCOPY WITH PROPOFOL: SHX5780

## 2018-05-31 LAB — CBC WITH DIFFERENTIAL/PLATELET
Abs Immature Granulocytes: 0.02 10*3/uL (ref 0.00–0.07)
Basophils Absolute: 0 10*3/uL (ref 0.0–0.1)
Basophils Relative: 0 %
EOS ABS: 0.2 10*3/uL (ref 0.0–0.5)
Eosinophils Relative: 3 %
HCT: 43.3 % (ref 39.0–52.0)
Hemoglobin: 14.7 g/dL (ref 13.0–17.0)
IMMATURE GRANULOCYTES: 0 %
Lymphocytes Relative: 26 %
Lymphs Abs: 1.9 10*3/uL (ref 0.7–4.0)
MCH: 30.1 pg (ref 26.0–34.0)
MCHC: 33.9 g/dL (ref 30.0–36.0)
MCV: 88.7 fL (ref 80.0–100.0)
MONOS PCT: 11 %
Monocytes Absolute: 0.8 10*3/uL (ref 0.1–1.0)
Neutro Abs: 4.2 10*3/uL (ref 1.7–7.7)
Neutrophils Relative %: 60 %
Platelets: 226 10*3/uL (ref 150–400)
RBC: 4.88 MIL/uL (ref 4.22–5.81)
RDW: 12.5 % (ref 11.5–15.5)
WBC: 7.2 10*3/uL (ref 4.0–10.5)
nRBC: 0 % (ref 0.0–0.2)

## 2018-05-31 LAB — COMPREHENSIVE METABOLIC PANEL
ALT: 35 U/L (ref 0–44)
AST: 30 U/L (ref 15–41)
Albumin: 4.3 g/dL (ref 3.5–5.0)
Alkaline Phosphatase: 43 U/L (ref 38–126)
Anion gap: 10 (ref 5–15)
BUN: 18 mg/dL (ref 8–23)
CO2: 26 mmol/L (ref 22–32)
Calcium: 8.7 mg/dL — ABNORMAL LOW (ref 8.9–10.3)
Chloride: 104 mmol/L (ref 98–111)
Creatinine, Ser: 0.87 mg/dL (ref 0.61–1.24)
GFR calc Af Amer: >60 mL/min (ref >60)
GFR calc non Af Amer: >60 mL/min (ref >60)
Glucose, Bld: 110 mg/dL — ABNORMAL HIGH (ref 70–99)
Potassium: 3.4 mmol/L — ABNORMAL LOW (ref 3.5–5.1)
Sodium: 140 mmol/L (ref 135–145)
Total Bilirubin: 0.8 mg/dL (ref 0.3–1.2)
Total Protein: 7.1 g/dL (ref 6.5–8.1)

## 2018-05-31 LAB — GLUCOSE, CAPILLARY
Glucose-Capillary: 114 mg/dL — ABNORMAL HIGH (ref 70–99)
Glucose-Capillary: 113 mg/dL — ABNORMAL HIGH (ref 70–99)

## 2018-05-31 LAB — MAGNESIUM: Magnesium: 2 mg/dL (ref 1.7–2.4)

## 2018-05-31 SURGERY — COLONOSCOPY WITH PROPOFOL
Anesthesia: General

## 2018-05-31 MED ORDER — DILTIAZEM LOAD VIA INFUSION
15.0000 mg | Freq: Once | INTRAVENOUS | Status: AC
  Administered 2018-05-31: 15 mg via INTRAVENOUS
  Filled 2018-05-31: qty 15

## 2018-05-31 MED ORDER — SODIUM CHLORIDE 0.9% FLUSH
3.0000 mL | Freq: Two times a day (BID) | INTRAVENOUS | Status: DC
  Administered 2018-06-01 – 2018-06-04 (×7): 3 mL via INTRAVENOUS

## 2018-05-31 MED ORDER — ONDANSETRON HCL 4 MG PO TABS
4.0000 mg | ORAL_TABLET | Freq: Four times a day (QID) | ORAL | Status: DC | PRN
  Filled 2018-05-31: qty 1

## 2018-05-31 MED ORDER — SODIUM CHLORIDE 0.9 % IV SOLN
INTRAVENOUS | Status: DC | PRN
  Administered 2018-06-01: 250 mL via INTRAVENOUS

## 2018-05-31 MED ORDER — INSULIN ASPART 100 UNIT/ML ~~LOC~~ SOLN: 0.0000 [IU] | Freq: Every day | SUBCUTANEOUS | Status: DC

## 2018-05-31 MED ORDER — ADENOSINE 6 MG/2ML IV SOLN
18.0000 mg | INTRAVENOUS | Status: DC
  Filled 2018-05-31: qty 6

## 2018-05-31 MED ORDER — ONDANSETRON HCL 4 MG/2ML IJ SOLN: 4.0000 mg | Freq: Four times a day (QID) | INTRAMUSCULAR | Status: DC | PRN

## 2018-05-31 MED ORDER — MAGNESIUM SULFATE 2 GM/50ML IV SOLN
2.0000 g | Freq: Once | INTRAVENOUS | Status: AC
  Administered 2018-05-31: 2 g via INTRAVENOUS
  Filled 2018-05-31: qty 50

## 2018-05-31 MED ORDER — METOPROLOL TARTRATE 5 MG/5ML IV SOLN
10.0000 mg | INTRAVENOUS | Status: AC
  Administered 2018-05-31: 10 mg via INTRAVENOUS
  Filled 2018-05-31: qty 10

## 2018-05-31 MED ORDER — ACETAMINOPHEN 650 MG RE SUPP
650.0000 mg | Freq: Four times a day (QID) | RECTAL | Status: DC | PRN
  Filled 2018-05-31: qty 1

## 2018-05-31 MED ORDER — ACETAMINOPHEN 325 MG PO TABS
650.0000 mg | ORAL_TABLET | Freq: Four times a day (QID) | ORAL | Status: DC | PRN
  Administered 2018-06-03: 650 mg via ORAL
  Filled 2018-05-31 (×2): qty 2

## 2018-05-31 MED ORDER — METOPROLOL TARTRATE 5 MG/5ML IV SOLN
5.0000 mg | Freq: Once | INTRAVENOUS | Status: AC
  Administered 2018-05-31: 5 mg via INTRAVENOUS
  Filled 2018-05-31: qty 5

## 2018-05-31 MED ORDER — SODIUM CHLORIDE 0.9 % IV SOLN
INTRAVENOUS | Status: DC
  Administered 2018-05-31: 1000 mL via INTRAVENOUS

## 2018-05-31 MED ORDER — INSULIN ASPART 100 UNIT/ML ~~LOC~~ SOLN: 0.0000 [IU] | Freq: Three times a day (TID) | SUBCUTANEOUS | Status: DC

## 2018-05-31 MED ORDER — POLYETHYLENE GLYCOL 3350 17 G PO PACK
17.0000 g | PACK | Freq: Every day | ORAL | Status: DC | PRN
  Filled 2018-05-31: qty 1

## 2018-05-31 MED ORDER — INSULIN ASPART 100 UNIT/ML ~~LOC~~ SOLN
0.0000 [IU] | Freq: Three times a day (TID) | SUBCUTANEOUS | Status: DC
  Administered 2018-06-03: 1 [IU] via SUBCUTANEOUS
  Filled 2018-05-31 (×2): qty 1

## 2018-05-31 MED ORDER — ALBUTEROL SULFATE (2.5 MG/3ML) 0.083% IN NEBU: 2.5000 mg | INHALATION_SOLUTION | RESPIRATORY_TRACT | Status: DC | PRN

## 2018-05-31 MED ORDER — POTASSIUM CHLORIDE CRYS ER 20 MEQ PO TBCR
40.0000 meq | EXTENDED_RELEASE_TABLET | Freq: Once | ORAL | Status: AC
  Administered 2018-05-31: 40 meq via ORAL
  Filled 2018-05-31: qty 2

## 2018-05-31 MED ORDER — DILTIAZEM HCL 100 MG IV SOLR
5.0000 mg/h | INTRAVENOUS | Status: DC
  Administered 2018-05-31: 5 mg/h via INTRAVENOUS
  Filled 2018-05-31 (×4): qty 100

## 2018-05-31 NOTE — ED Triage Notes (Signed)
Pt in endo for colonoscopy and found to have HR 140's. Metoprolol 5 mg given without effect. Dr Clayborn Bigness to bedside and adenosin  6mg  and 12 mg given without result. Pt transported to ED. HR 140s ST. Pt denies cardiac hx.

## 2018-05-31 NOTE — H&P (Signed)
Outpatient short stay form Pre-procedure 05/31/2018 1:17 PM  K. Alice Reichert, M.D.  Primary Physician: Pernell Dupre, M.D.  Reason for visit:  Heme positive stool  History of present illness:  Patient presents for diagnostic colonoscopy for heme positive stool. Denies abdominal pain, fever, hematochezia or weight loss. Unfortunately, the patient was noted to have a tachyarrhythmia in the pre-op area by the anesthesiologist and cardiology has been seen to evaluate.    Current Facility-Administered Medications:  .  0.9 %  sodium chloride infusion, , Intravenous, Continuous, , Benay Pike, MD  Medications Prior to Admission  Medication Sig Dispense Refill Last Dose  . atorvastatin (LIPITOR) 10 MG tablet Take 10 mg by mouth daily.     . cyclobenzaprine (FLEXERIL) 10 MG tablet Take 10 mg by mouth 3 (three) times daily as needed for muscle spasms.     . fexofenadine (ALLEGRA) 60 MG tablet Take 60 mg by mouth 2 (two) times daily.     . ASPIRIN 81 PO Take 1 tablet by mouth daily.   05/27/2018  . chlorthalidone (HYGROTON) 25 MG tablet Take 25 mg by mouth every morning.    Taking  . fluticasone (FLONASE) 50 MCG/ACT nasal spray Place 1 spray into both nostrils daily.    Taking  . Fluticasone-Salmeterol (ADVAIR DISKUS) 250-50 MCG/DOSE AEPB Inhale 1 puff into the lungs 2 (two) times daily.   05/31/2018 at 0700  . ibuprofen (ADVIL,MOTRIN) 600 MG tablet Take 1 tablet by mouth as needed for pain.   Taking  . Ipratropium-Albuterol (COMBIVENT RESPIMAT IN) Inhale 1 puff into the lungs 3 (three) times daily.    Not Taking at Unknown time  . linagliptin (TRADJENTA) 5 MG TABS tablet Take 5 mg by mouth daily.   Taking  . metFORMIN (GLUCOPHAGE) 1000 MG tablet Take 1,000 mg by mouth 2 (two) times daily with a meal.   Taking  . methocarbamol (ROBAXIN) 500 MG tablet Take 1 tablet (500 mg total) by mouth every 6 (six) hours as needed for muscle spasms. (Patient not taking: Reported on 04/20/2016) 80 tablet 0 Not  Taking  . oxyCODONE (OXY IR/ROXICODONE) 5 MG immediate release tablet Take 1-2 tablets (5-10 mg total) by mouth every 3 (three) hours as needed for breakthrough pain. (Patient not taking: Reported on 04/20/2016) 80 tablet 0 Not Taking  . pravastatin (PRAVACHOL) 80 MG tablet Take 80 mg by mouth at bedtime.    Taking  . tiotropium (SPIRIVA) 18 MCG inhalation capsule Place 18 mcg into inhaler and inhale daily.   05/31/2018 at 0600  . traMADol (ULTRAM) 50 MG tablet Take 1-2 tablets (50-100 mg total) by mouth every 6 (six) hours as needed (mild pain). (Patient not taking: Reported on 04/20/2016) 80 tablet 1 Not Taking  . traZODone (DESYREL) 50 MG tablet Take 50 mg by mouth at bedtime as needed.    Taking  . Varenicline Tartrate (CHANTIX PO) Take 1 tablet by mouth 2 (two) times daily.   Taking     No Known Allergies   Past Medical History:  Diagnosis Date  . Arthritis    oa  . COPD (chronic obstructive pulmonary disease) ([place])   . History of blood transfusion 1973  . Hyperlipidemia   . Hypertension   . Pneumonia years ago   walking pneumonia  . Shortness of breath dyspnea    with exertion    Review of systems:  Otherwise negative.    Physical Exam  Gen: Alert, oriented. Appears stated age.  HEENT: [place]/AT. PERRLA. Lungs: CTA, no  wheezes. CV: RR nl S1, S2. Abd: soft, benign, no masses. BS+ Ext: No edema. Pulses 2+    Planned procedures: Currently we are unable to proceed with colonoscopy.  Procedure will be canceled today and rescheduled after cardiology evaluation and treatment, if necessary.    K. Alice Reichert, M.D. Gastroenterology 05/31/2018  1:17 PM

## 2018-05-31 NOTE — ED Notes (Signed)
Pt given ginger ale. MD aware.

## 2018-05-31 NOTE — H&P (Addendum)
[facility] NAME: David Anderson    MR#:  [id number]  DATE OF BIRTH:  06-29-1955  DATE OF ADMISSION:  05/31/2018  PRIMARY CARE PHYSICIAN: Jodi Marble, MD   REQUESTING/REFERRING PHYSICIAN: Dr. Quentin Cornwall  CHIEF COMPLAINT:   Chief Complaint  Patient presents with  . Tachycardia    HISTORY OF PRESENT ILLNESS:  David Anderson  is a 63 y.o. male with a known history of diabetes mellitus, COPD, tobacco use presented to the hospital for routine colonoscopy after a clinic stool card was positive for occult blood.  Patient was found to be tachycardic.  Was taken to cardiology Dr. Telford Nab office.  He was given 2 doses of adenosine and metoprolol with no improvement.  Sent to the emergency room.  Here patient has been started on Cardizem drip but heart rate continues to be in the 140s and is being admitted to the hospitalist service.  Presently in atrial flutter. No frank blood.  Hemoglobin 14.5.  No melena. No history of atrial fibrillation/flutter  PAST MEDICAL HISTORY:   Past Medical History:  Diagnosis Date  . Arthritis    oa  . COPD (chronic obstructive pulmonary disease) ([place])   . History of blood transfusion 1973  . Hyperlipidemia   . Hypertension   . Pneumonia years ago   walking pneumonia  . Shortness of breath dyspnea    with exertion    PAST SURGICAL HISTORY:   Past Surgical History:  Procedure Laterality Date  . BACK SURGERY  1991   lower back  . left shoulder rotator cuff repair  2001  . surgery for staph infection  1973   both legs left worse than right, both legs open and drained  . TOTAL HIP ARTHROPLASTY Left 02/11/2015   Procedure: TOTAL HIP ARTHROPLASTY ANTERIOR APPROACH;  Surgeon: Gaynelle Arabian, MD;  Location: [facility];  Service: Orthopedics;  Laterality: Left;    SOCIAL HISTORY:   Social History   Tobacco Use  . Smoking status: Current Every Day Smoker    Packs/day: 0.50    Years: 35.00    Pack  years: 17.50    Types: Cigarettes  . Smokeless tobacco: Never Used  Substance Use Topics  . Alcohol use: Yes    Alcohol/week: 0.0 - 2.0 standard drinks    Comment: social     FAMILY HISTORY:  History reviewed. No pertinent family history.  No colon cancer  DRUG ALLERGIES:  No Known Allergies  REVIEW OF SYSTEMS:   Review of Systems  Constitutional: Positive for malaise/fatigue. Negative for chills, fever and weight loss.  HENT: Negative for hearing loss and nosebleeds.   Eyes: Negative for blurred vision, double vision and pain.  Respiratory: Negative for cough, hemoptysis, sputum production, shortness of breath and wheezing.   Cardiovascular: Negative for chest pain, palpitations, orthopnea and leg swelling.  Gastrointestinal: Negative for abdominal pain, constipation, diarrhea, nausea and vomiting.  Genitourinary: Negative for dysuria and hematuria.  Musculoskeletal: Negative for back pain, falls and myalgias.  Skin: Negative for rash.  Neurological: Negative for dizziness, tremors, sensory change, speech change, focal weakness, seizures and headaches.  Endo/Heme/Allergies: Does not bruise/bleed easily.  Psychiatric/Behavioral: Negative for depression and memory loss. The patient is not nervous/anxious.     MEDICATIONS AT HOME:   Prior to Admission medications   Medication Sig Start Date End Date Taking? Authorizing Provider  ASPIRIN 81 PO Take 1 tablet by mouth daily.    [provider]  atorvastatin (LIPITOR)  10 MG tablet Take 10 mg by mouth daily.    [provider]  chlorthalidone (HYGROTON) 25 MG tablet Take 25 mg by mouth every morning.     [provider]  cyclobenzaprine (FLEXERIL) 10 MG tablet Take 10 mg by mouth 3 (three) times daily as needed for muscle spasms.    [provider]  fexofenadine (ALLEGRA) 60 MG tablet Take 60 mg by mouth 2 (two) times daily.    [provider]  fluticasone (FLONASE) 50 MCG/ACT nasal  spray Place 1 spray into both nostrils daily.     [provider]  Fluticasone-Salmeterol (ADVAIR DISKUS) 250-50 MCG/DOSE AEPB Inhale 1 puff into the lungs 2 (two) times daily.    [provider]  ibuprofen (ADVIL,MOTRIN) 600 MG tablet Take 1 tablet by mouth as needed for pain.    [provider]  Ipratropium-Albuterol (COMBIVENT RESPIMAT IN) Inhale 1 puff into the lungs 3 (three) times daily.     [provider]  linagliptin (TRADJENTA) 5 MG TABS tablet Take 5 mg by mouth daily.    [provider]  metFORMIN (GLUCOPHAGE) 1000 MG tablet Take 1,000 mg by mouth 2 (two) times daily with a meal.    [provider]  methocarbamol (ROBAXIN) 500 MG tablet Take 1 tablet (500 mg total) by mouth every 6 (six) hours as needed for muscle spasms. Patient not taking: Reported on 04/20/2016 02/12/15   Perkins, Alexzandrew L, PA-C  oxyCODONE (OXY IR/ROXICODONE) 5 MG immediate release tablet Take 1-2 tablets (5-10 mg total) by mouth every 3 (three) hours as needed for breakthrough pain. Patient not taking: Reported on 04/20/2016 02/12/15   Dara Lords, Alexzandrew L, PA-C  pravastatin (PRAVACHOL) 80 MG tablet Take 80 mg by mouth at bedtime.     [provider]  tiotropium (SPIRIVA) 18 MCG inhalation capsule Place 18 mcg into inhaler and inhale daily.    [provider]  traMADol (ULTRAM) 50 MG tablet Take 1-2 tablets (50-100 mg total) by mouth every 6 (six) hours as needed (mild pain). Patient not taking: Reported on 04/20/2016 02/12/15   Dara Lords, Alexzandrew L, PA-C  traZODone (DESYREL) 50 MG tablet Take 50 mg by mouth at bedtime as needed.     [provider]  Varenicline Tartrate (CHANTIX PO) Take 1 tablet by mouth 2 (two) times daily.    [provider]     VITAL SIGNS:  Blood pressure 111/89, pulse (!) 140, temperature 98.1 F (36.7 C), temperature source Oral, resp. rate 18, height 5\' 9"  (1.753 m), weight 82 kg, SpO2 96  %.  PHYSICAL EXAMINATION:  Physical Exam  GENERAL:  63 y.o.-year-old patient lying in the bed with no acute distress.  EYES: Pupils equal, round, reactive to light and accommodation. No scleral icterus. Extraocular muscles intact.  HEENT: Head atraumatic, normocephalic. Oropharynx and nasopharynx clear. No oropharyngeal erythema, moist oral mucosa  NECK:  Supple, no jugular venous distention. No thyroid enlargement, no tenderness.  LUNGS: Normal breath sounds bilaterally, no wheezing, rales, rhonchi. No use of accessory muscles of respiration.  CARDIOVASCULAR: Tachycardia ABDOMEN: Soft, nontender, nondistended. Bowel sounds present. No organomegaly or mass.  EXTREMITIES: No pedal edema, cyanosis, or clubbing. + 2 pedal & radial pulses b/l.   NEUROLOGIC: Cranial nerves II through XII are intact. No focal Motor or sensory deficits appreciated b/l PSYCHIATRIC: The patient is alert and oriented x 3. Good affect.  SKIN: No obvious rash, lesion, or ulcer.   LABORATORY PANEL:   CBC Recent Labs  Lab  05/31/18 1543  WBC 7.2  HGB 14.7  HCT 43.3  PLT 226   ------------------------------------------------------------------------------------------------------------------  Chemistries  Recent Labs  Lab 05/31/18 1543  NA 140  K 3.4*  CL 104  CO2 26  GLUCOSE 110*  BUN 18  CREATININE 0.87  CALCIUM 8.7*  AST 30  ALT 35  ALKPHOS 43  BILITOT 0.8   ------------------------------------------------------------------------------------------------------------------  Cardiac Enzymes No results for input(s): TROPONINI in the last 168 hours. ------------------------------------------------------------------------------------------------------------------  RADIOLOGY:  Dg Chest Portable 1 View  Result Date: 05/31/2018 CLINICAL DATA:  Tachycardia today. EXAM: PORTABLE CHEST 1 VIEW COMPARISON:  None. FINDINGS: Cardiac silhouette is normal in size. No mediastinal or hilar masses. No evidence of  adenopathy. Lungs are clear.  No convincing pleural effusion.  No pneumothorax. Skeletal structures are grossly intact. IMPRESSION: No active disease. Electronically Signed   By: Lajean Manes M.D.   On: 05/31/2018 16:09   IMPRESSION AND PLAN:   * Atrial flutter with rapid ventricular rate On Cardizem drip with heart rate in the 140s.  We will give stat dose of IV metoprolol 10 mg.  Consult cardiology.  Dr. Clayborn Bigness has requested Dr. Chancy Milroy followed the patient as patient sees Dr. Woody Seller.  Sent message to Dr. Chancy Milroy.  Consult added.  Echocardiogram ordered.  Check TSH.  Admit to telemetry floor. Not starting any anticoagulation due to occult blood in stool card found.  Will wait for cardiology input.  *COPD.  Has chronic mild wheezing which is stable.  Nebulizers ordered  *Stool positive for occult blood.  This was on routine stool card test in the office.  Will need colonoscopy once stabilized.  As outpatient.  * Tobacco use Counseled to quit > 3 minutes  *DVT prophylaxis with SCDs  All the records are reviewed and case discussed with ED provider. Management plans discussed with the patient, family and they are in agreement.  CODE STATUS: DNR/DNI  Discussed with patient with his fiance at bedside.  He has no documented healthcare power of attorney.  We discussed regarding patient's CODE STATUS and he wishes to be DO NOT RESUSCITATE DO NOT INTUBATE.  Explained in detail.  Orders entered.  CODE STATUS changed.  TOTAL CRITICAL CARE TIME TAKING CARE OF THIS PATIENT: 80 minutes.   Leia Alf Emon Lance M.D on 05/31/2018 at 5:14 PM  Between 7am to 6pm - Pager - [phone]  After 6pm go to [email or web address] - password [email or web address] [facility]  Office  [phone]  CC: Primary care physician; Jodi Marble, MD  Note: This dictation was prepared with Dragon dictation along with smaller phrase technology. Any transcriptional errors that result from this process are  unintentional.

## 2018-05-31 NOTE — Anesthesia Preprocedure Evaluation (Signed)
Anesthesia Evaluation  Patient identified by MRN, date of birth, ID band Patient awake    Reviewed: Allergy & Precautions, NPO status , Patient's Chart, lab work & pertinent test results  History of Anesthesia Complications Negative for: history of anesthetic complications  Airway Mallampati: II  TM Distance: >3 FB Neck ROM: Full    Dental no notable dental hx.    Pulmonary neg sleep apnea, COPD, Current Smoker,    breath sounds clear to auscultation- rhonchi (-) wheezing      Cardiovascular hypertension, Pt. on medications (-) CAD, (-) Past MI, (-) Cardiac Stents and (-) CABG + dysrhythmias (WPW)  Rhythm:Regular Rate:Normal - Systolic murmurs and - Diastolic murmurs    Neuro/Psych neg Seizures negative neurological ROS  negative psych ROS   GI/Hepatic negative GI ROS, Neg liver ROS,   Endo/Other  negative endocrine ROSneg diabetes  Renal/GU negative Renal ROS     Musculoskeletal  (+) Arthritis ,   Abdominal (+) - obese,   Peds  Hematology negative hematology ROS (+)   Anesthesia Other Findings   Reproductive/Obstetrics                             Anesthesia Physical Anesthesia Plan  ASA: III  Anesthesia Plan: General   Post-op Pain Management:    Induction: Intravenous  PONV Risk Score and Plan: 0 and Propofol infusion  Airway Management Planned: Natural Airway  Additional Equipment:   Intra-op Plan:   Post-operative Plan:   Informed Consent: I have reviewed the patients History and Physical, chart, labs and discussed the procedure including the risks, benefits and alternatives for the proposed anesthesia with the patient or authorized representative who has indicated his/her understanding and acceptance.     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

## 2018-05-31 NOTE — ED Provider Notes (Signed)
[facility] Emergency Department Provider Note    First MD Initiated Contact with Patient 05/31/18 1508     (approximate)  I have reviewed the triage vital signs and the nursing notes.   HISTORY  Chief Complaint Tachycardia    HPI David Anderson is a 63 y.o. male the below listed past medical history presents the ER for evaluation of  tachycardia.  Patient was being evaluated for outpatient endoscopy was noted to have tachycardia in the 140s.  He was sent from endoscopy suite to cardiology clinic where he was evaluated by Dr. Verta Ellen.  He is given a trial of metoprolol is initial EKG appeared more consistent with SVT followed by adenosine x2 subsequently there was evidence of underlying a flutter.  Patient is completely asymptomatic.  Is uncertain how long this is been going on.  He does not take any blood thinners.  Denies any history of heart disease.  Does have a history of asthma.  Does smoke.   Past Medical History:  Diagnosis Date  . Arthritis    oa  . COPD (chronic obstructive pulmonary disease) ([place])   . History of blood transfusion 1973  . Hyperlipidemia   . Hypertension   . Pneumonia years ago   walking pneumonia  . Shortness of breath dyspnea    with exertion   History reviewed. No pertinent family history. Past Surgical History:  Procedure Laterality Date  . BACK SURGERY  1991   lower back  . left shoulder rotator cuff repair  2001  . surgery for staph infection  1973   both legs left worse than right, both legs open and drained  . TOTAL HIP ARTHROPLASTY Left 02/11/2015   Procedure: TOTAL HIP ARTHROPLASTY ANTERIOR APPROACH;  Surgeon: Gaynelle Arabian, MD;  Location: [facility] ORS;  Service: Orthopedics;  Laterality: Left;   Patient Active Problem List   Diagnosis Date Noted  . OA (osteoarthritis) of hip 02/11/2015      Prior to Admission medications   Medication Sig Start Date End Date Taking? Authorizing Provider  ASPIRIN 81 PO Take 1  tablet by mouth daily.    [provider]  atorvastatin (LIPITOR) 10 MG tablet Take 10 mg by mouth daily.    [provider]  chlorthalidone (HYGROTON) 25 MG tablet Take 25 mg by mouth every morning.     [provider]  cyclobenzaprine (FLEXERIL) 10 MG tablet Take 10 mg by mouth 3 (three) times daily as needed for muscle spasms.    [provider]  fexofenadine (ALLEGRA) 60 MG tablet Take 60 mg by mouth 2 (two) times daily.    [provider]  fluticasone (FLONASE) 50 MCG/ACT nasal spray Place 1 spray into both nostrils daily.     [provider]  Fluticasone-Salmeterol (ADVAIR DISKUS) 250-50 MCG/DOSE AEPB Inhale 1 puff into the lungs 2 (two) times daily.    [provider]  ibuprofen (ADVIL,MOTRIN) 600 MG tablet Take 1 tablet by mouth as needed for pain.    [provider]  Ipratropium-Albuterol (COMBIVENT RESPIMAT IN) Inhale 1 puff into the lungs 3 (three) times daily.     [provider]  linagliptin (TRADJENTA) 5 MG TABS tablet Take 5 mg by mouth daily.    [provider]  metFORMIN (GLUCOPHAGE) 1000 MG tablet Take 1,000 mg by mouth 2 (two) times daily with a meal.    [provider]  methocarbamol (ROBAXIN) 500 MG tablet Take 1 tablet (500 mg total) by mouth  every 6 (six) hours as needed for muscle spasms. Patient not taking: Reported on 04/20/2016 02/12/15   Perkins, Alexzandrew L, PA-C  oxyCODONE (OXY IR/ROXICODONE) 5 MG immediate release tablet Take 1-2 tablets (5-10 mg total) by mouth every 3 (three) hours as needed for breakthrough pain. Patient not taking: Reported on 04/20/2016 02/12/15   Dara Lords, Alexzandrew L, PA-C  pravastatin (PRAVACHOL) 80 MG tablet Take 80 mg by mouth at bedtime.     [provider]  tiotropium (SPIRIVA) 18 MCG inhalation capsule Place 18 mcg into inhaler and inhale daily.    [provider]  traMADol (ULTRAM) 50 MG tablet Take 1-2 tablets (50-100 mg  total) by mouth every 6 (six) hours as needed (mild pain). Patient not taking: Reported on 04/20/2016 02/12/15   Dara Lords, Alexzandrew L, PA-C  traZODone (DESYREL) 50 MG tablet Take 50 mg by mouth at bedtime as needed.     [provider]  Varenicline Tartrate (CHANTIX PO) Take 1 tablet by mouth 2 (two) times daily.    [provider]    Allergies Patient has no known allergies.    Social History Social History   Tobacco Use  . Smoking status: Current Every Day Smoker    Packs/day: 0.50    Years: 35.00    Pack years: 17.50    Types: Cigarettes  . Smokeless tobacco: Never Used  Substance Use Topics  . Alcohol use: Yes    Alcohol/week: 0.0 - 2.0 standard drinks    Comment: social   . Drug use: No    Review of Systems Patient denies headaches, rhinorrhea, blurry vision, numbness, shortness of breath, chest pain, edema, cough, abdominal pain, nausea, vomiting, diarrhea, dysuria, fevers, rashes or hallucinations unless otherwise stated above in HPI. ____________________________________________   PHYSICAL EXAM:  VITAL SIGNS: Vitals:   05/31/18 1512 05/31/18 1600  BP: (!) 118/92 119/82  Pulse: (!) 141 (!) 137  Resp:  16  Temp: 98.1 F (36.7 C)   SpO2: 96% 96%    Constitutional: Alert and oriented.  Eyes: Conjunctivae are normal.  Head: Atraumatic. Nose: No congestion/rhinnorhea. Mouth/Throat: Mucous membranes are moist.   Neck: No stridor. Painless ROM.  Cardiovascular: tachycardic rate, regular rhythm. Grossly normal heart sounds.  Good peripheral circulation. Respiratory: Normal respiratory effort.  No retractions. Lungs with coarse wheezing throughout Gastrointestinal: Soft and nontender. No distention. No abdominal bruits. No CVA tenderness. Genitourinary:  Musculoskeletal: No lower extremity tenderness nor edema.  No joint effusions. Neurologic:  Normal speech and language. No gross focal neurologic deficits are appreciated. No facial  droop Skin:  Skin is warm, dry and intact. No rash noted. Psychiatric: Mood and affect are normal. Speech and behavior are normal.  ____________________________________________   LABS (all labs ordered are listed, but only abnormal results are displayed)  Results for orders placed or performed during the hospital encounter of 05/31/18 (from the past 24 hour(s))  CBC with Differential/Platelet     Status: None   Collection Time: 05/31/18  3:43 PM  Result Value Ref Range   WBC 7.2 4.0 - 10.5 K/uL   RBC 4.88 4.22 - 5.81 MIL/uL   Hemoglobin 14.7 13.0 - 17.0 g/dL   HCT 43.3 39.0 - 52.0 %   MCV 88.7 80.0 - 100.0 fL   MCH 30.1 26.0 - 34.0 pg   MCHC 33.9 30.0 - 36.0 g/dL   RDW 12.5 11.5 - 15.5 %   Platelets 226 150 - 400 K/uL   nRBC 0.0 0.0 - 0.2 %  Neutrophils Relative % 60 %   Neutro Abs 4.2 1.7 - 7.7 K/uL   Lymphocytes Relative 26 %   Lymphs Abs 1.9 0.7 - 4.0 K/uL   Monocytes Relative 11 %   Monocytes Absolute 0.8 0.1 - 1.0 K/uL   Eosinophils Relative 3 %   Eosinophils Absolute 0.2 0.0 - 0.5 K/uL   Basophils Relative 0 %   Basophils Absolute 0.0 0.0 - 0.1 K/uL   Immature Granulocytes 0 %   Abs Immature Granulocytes 0.02 0.00 - 0.07 K/uL  Comprehensive metabolic panel     Status: Abnormal   Collection Time: 05/31/18  3:43 PM  Result Value Ref Range   Sodium 140 135 - 145 mmol/L   Potassium 3.4 (L) 3.5 - 5.1 mmol/L   Chloride 104 98 - 111 mmol/L   CO2 26 22 - 32 mmol/L   Glucose, Bld 110 (H) 70 - 99 mg/dL   BUN 18 8 - 23 mg/dL   Creatinine, Ser 0.87 0.61 - 1.24 mg/dL   Calcium 8.7 (L) 8.9 - 10.3 mg/dL   Total Protein 7.1 6.5 - 8.1 g/dL   Albumin 4.3 3.5 - 5.0 g/dL   AST 30 15 - 41 U/L   ALT 35 0 - 44 U/L   Alkaline Phosphatase 43 38 - 126 U/L   Total Bilirubin 0.8 0.3 - 1.2 mg/dL   GFR calc non Af Amer >60 >60 mL/min   GFR calc Af Amer >60 >60 mL/min   Anion gap 10 5 - 15   ____________________________________________  EKG My review and personal interpretation at  Time: 15:06   Indication: palpitations  Rate: 140  Rhythm: aflutter with rvr  Axis: normal Other: poor r wave progression, non specific st abn, no stemi ____________________________________________  RADIOLOGY  I personally reviewed all radiographic images ordered to evaluate for the above acute complaints and reviewed radiology reports and findings.  These findings were personally discussed with the patient.  Please see medical record for radiology report.  ____________________________________________   PROCEDURES  Procedure(s) performed:  .Critical Care Performed by: Merlyn Lot, MD Authorized by: Merlyn Lot, MD   Critical care provider statement:    Critical care time (minutes):  30   Critical care time was exclusive of:  Separately billable procedures and treating other patients   Critical care was necessary to treat or prevent imminent or life-threatening deterioration of the following conditions:  Cardiac failure   Critical care was time spent personally by me on the following activities:  Development of treatment plan with patient or surrogate, discussions with consultants, evaluation of patient's response to treatment, examination of patient, obtaining history from patient or surrogate, ordering and performing treatments and interventions, ordering and review of laboratory studies, ordering and review of radiographic studies, pulse oximetry, re-evaluation of patient's condition and review of old charts      Critical Care performed: yes ____________________________________________   INITIAL IMPRESSION / [place] / ED COURSE  Pertinent labs & imaging results that were available during my care of the patient were reviewed by me and considered in my medical decision making (see chart for details).   DDX: Dysrhythmia, electrolyte abnormality, dehydration, cardiomyopathy, lung disease  GARIK DIAMANT is a 63 y.o. who presents to the ED with symptoms as  described above.  He is afebrile but tachycardic with evidence of atrial flutter with RVR.  Uncertain duration not a candidate for cardioversion at this time as he is not on anticoagulation.  Will trial Cardizem drip as  well as IV magnesium as well as IV fluids.  Blood will be sent for the above differential.  Patient will likely require hospitalization.  Clinical Course as of May 31 1631  Tue May 31, 2018  1622 Assessed.  Heart rate, down to 135 on Cardizem.  Patient remains asymptomatic.   [PR]  1630 Blood work is reassuring at this point do believe he stable and appropriate for admission to the hospital for further medical work-up.   [PR]    Clinical Course User Index [PR] Merlyn Lot, MD     As part of my medical decision making, I reviewed the following data within the [place] notes reviewed and incorporated, Labs reviewed, notes from prior ED visits and [place] Controlled Substance Database   ____________________________________________   FINAL CLINICAL IMPRESSION(S) / ED DIAGNOSES  Final diagnoses:  Atrial fibrillation with rapid ventricular response ([place])      NEW MEDICATIONS STARTED DURING THIS VISIT:  New Prescriptions   No medications on file     Note:  This document was prepared using Dragon voice recognition software and may include unintentional dictation errors.    Merlyn Lot, MD 05/31/18 [id number]

## 2018-06-01 ENCOUNTER — Encounter: Payer: Self-pay | Admitting: Internal Medicine

## 2018-06-01 ENCOUNTER — Inpatient Hospital Stay
Admit: 2018-06-01 | Discharge: 2018-06-01 | Disposition: A | Discharge status: Home / Self Care | Payer: PPO | Attending: Internal Medicine | Admitting: Internal Medicine

## 2018-06-01 LAB — CBC
HCT: 42.2 % (ref 39.0–52.0)
Hemoglobin: 14.3 g/dL (ref 13.0–17.0)
MCH: 30.2 pg (ref 26.0–34.0)
MCHC: 33.9 g/dL (ref 30.0–36.0)
MCV: 89.2 fL (ref 80.0–100.0)
Platelets: 209 10*3/uL (ref 150–400)
RBC: 4.73 MIL/uL (ref 4.22–5.81)
RDW: 12.6 % (ref 11.5–15.5)
WBC: 6.5 10*3/uL (ref 4.0–10.5)
nRBC: 0 % (ref 0.0–0.2)

## 2018-06-01 LAB — GLUCOSE, CAPILLARY
Glucose-Capillary: 112 mg/dL — ABNORMAL HIGH (ref 70–99)
Glucose-Capillary: 137 mg/dL — ABNORMAL HIGH (ref 70–99)
Glucose-Capillary: 239 mg/dL — ABNORMAL HIGH (ref 70–99)
Glucose-Capillary: 149 mg/dL — ABNORMAL HIGH (ref 70–99)

## 2018-06-01 LAB — BASIC METABOLIC PANEL
Anion gap: 9 (ref 5–15)
BUN: 18 mg/dL (ref 8–23)
CO2: 25 mmol/L (ref 22–32)
Calcium: 8.7 mg/dL — ABNORMAL LOW (ref 8.9–10.3)
Chloride: 104 mmol/L (ref 98–111)
Creatinine, Ser: 0.63 mg/dL (ref 0.61–1.24)
GFR calc Af Amer: >60 mL/min (ref >60)
GFR calc non Af Amer: >60 mL/min (ref >60)
Glucose, Bld: 124 mg/dL — ABNORMAL HIGH (ref 70–99)
Potassium: 3.6 mmol/L (ref 3.5–5.1)
Sodium: 138 mmol/L (ref 135–145)

## 2018-06-01 LAB — HEMOGLOBIN A1C
Hgb A1c MFr Bld: 6.1 % — ABNORMAL HIGH (ref 4.8–5.6)
MEAN PLASMA GLUCOSE: 128.37 mg/dL

## 2018-06-01 MED ORDER — FLUTICASONE PROPIONATE 50 MCG/ACT NA SUSP
1.0000 | Freq: Every day | NASAL | Status: DC
  Administered 2018-06-01 – 2018-06-04 (×3): 1 via NASAL
  Filled 2018-06-01 (×2): qty 16

## 2018-06-01 MED ORDER — PRAVASTATIN SODIUM 40 MG PO TABS
80.0000 mg | ORAL_TABLET | Freq: Every day | ORAL | Status: DC
  Administered 2018-06-01 – 2018-06-03 (×4): 80 mg via ORAL
  Filled 2018-06-01 (×5): qty 2

## 2018-06-01 MED ORDER — TIOTROPIUM BROMIDE MONOHYDRATE 18 MCG IN CAPS
18.0000 ug | ORAL_CAPSULE | Freq: Every morning | RESPIRATORY_TRACT | Status: DC
  Administered 2018-06-01 – 2018-06-04 (×3): 18 ug via RESPIRATORY_TRACT
  Filled 2018-06-01 (×2): qty 5

## 2018-06-01 MED ORDER — TRAZODONE HCL 50 MG PO TABS
50.0000 mg | ORAL_TABLET | Freq: Every evening | ORAL | Status: DC | PRN
  Filled 2018-06-01: qty 1

## 2018-06-01 MED ORDER — ASPIRIN 81 MG PO CHEW
81.0000 mg | CHEWABLE_TABLET | Freq: Every day | ORAL | Status: DC
  Administered 2018-06-01 – 2018-06-02 (×2): 81 mg via ORAL
  Filled 2018-06-01 (×3): qty 1

## 2018-06-01 MED ORDER — AMIODARONE HCL IN DEXTROSE 360-4.14 MG/200ML-% IV SOLN
60.0000 mg/h | INTRAVENOUS | Status: DC
  Administered 2018-06-01 (×2): 60 mg/h via INTRAVENOUS
  Filled 2018-06-01 (×2): qty 200

## 2018-06-01 MED ORDER — LORATADINE 10 MG PO TABS
10.0000 mg | ORAL_TABLET | Freq: Every day | ORAL | Status: DC
  Administered 2018-06-01 – 2018-06-04 (×4): 10 mg via ORAL
  Filled 2018-06-01 (×5): qty 1

## 2018-06-01 MED ORDER — AMIODARONE LOAD VIA INFUSION
150.0000 mg | Freq: Once | INTRAVENOUS | Status: AC
  Administered 2018-06-01: 150 mg via INTRAVENOUS
  Filled 2018-06-01: qty 83.34

## 2018-06-01 MED ORDER — AMIODARONE HCL IN DEXTROSE 360-4.14 MG/200ML-% IV SOLN
30.0000 mg/h | INTRAVENOUS | Status: DC
  Administered 2018-06-01 – 2018-06-02 (×2): 30 mg/h via INTRAVENOUS
  Filled 2018-06-01: qty 200

## 2018-06-01 MED ORDER — CYCLOBENZAPRINE HCL 10 MG PO TABS: 10.0000 mg | ORAL_TABLET | Freq: Three times a day (TID) | ORAL | Status: DC | PRN

## 2018-06-01 NOTE — ED Notes (Signed)
Pt eating food at this time without distress. Family at bedside. Lights remain dimmed at this time. Pt in NAD and call bell in reach. Bed locked and in lowest position.

## 2018-06-01 NOTE — ED Notes (Signed)
Admitting MD at bedside.

## 2018-06-01 NOTE — Consult Note (Addendum)
David Anderson is a 63 y.o. male  [id number]  Primary Cardiologist: new patient to Dr. Humphrey Rolls Reason for Consultation: New onset atrial flutter with RVR  HPI: 63yo male with a past medical history of type 2 diabetes, HTN, HLP, COPD, and dyspnea with exertion was sent to ER when he was noted to be in atrial flutter RVR and did not respond to adenosine and metoprolol given by Dr. Clayborn Bigness in his office. He is on a a Cardizem drip and heart rate has come down to the 110s.    Review of Systems: Feeling well, no shortness of breath or chest pain.    Past Medical History:  Diagnosis Date  . Arthritis    oa  . COPD (chronic obstructive pulmonary disease) ([place])   . History of blood transfusion 1973  . Hyperlipidemia   . Hypertension   . Pneumonia years ago   walking pneumonia  . Shortness of breath dyspnea    with exertion    (Not in a hospital admission)    . aspirin  81 mg Oral Daily  . fluticasone  1 spray Each Nare Daily  . insulin aspart  0-9 Units Subcutaneous TID WC  . loratadine  10 mg Oral Daily  . pravastatin  80 mg Oral QHS  . sodium chloride flush  3 mL Intravenous Q12H  . tiotropium  18 mcg Inhalation Daily    Infusions: . sodium chloride    . diltiazem (CARDIZEM) infusion 15 mg/hr (06/01/18 1540)    No Known Allergies  Social History   Socioeconomic History  . Marital status: Divorced    Spouse name: Not on file  . Number of children: Not on file  . Years of education: Not on file  . Highest education level: Not on file  Occupational History  . Not on file  Social Needs  . Financial resource strain: Not on file  . Food insecurity:    Worry: Not on file    Inability: Not on file  . Transportation needs:    Medical: Not on file    Non-medical: Not on file  Tobacco Use  . Smoking status: Current Every Day Smoker    Packs/day: 0.50    Years: 35.00    Pack years: 17.50    Types: Cigarettes  . Smokeless tobacco: Never Used  Substance and Sexual  Activity  . Alcohol use: Yes    Alcohol/week: 0.0 - 2.0 standard drinks    Comment: social   . Drug use: No  . Sexual activity: Not on file  Lifestyle  . Physical activity:    Days per week: Not on file    Minutes per session: Not on file  . Stress: Not on file  Relationships  . Social connections:    Talks on phone: Not on file    Gets together: Not on file    Attends religious service: Not on file    Active member of club or organization: Not on file    Attends meetings of clubs or organizations: Not on file    Relationship status: Not on file  . Intimate partner violence:    Fear of current or ex partner: Not on file    Emotionally abused: Not on file    Physically abused: Not on file    Forced sexual activity: Not on file  Other Topics Concern  . Not on file  Social History Narrative  . Not on file    History reviewed. No pertinent  family history.  PHYSICAL EXAM: Vitals:   06/01/18 0755 06/01/18 0830  BP:  109/74  Pulse: (!) 105 (!) 48  Resp: (!) 23 19  Temp:    SpO2: 93% 93%    No intake or output data in the 24 hours ending 06/01/18 0844  General:  Well appearing. No respiratory difficulty HEENT: normal Neck: supple. no JVD. Carotids 2+ bilat; no bruits. No lymphadenopathy or thryomegaly appreciated. Cor: PMI nondisplaced. Regular rate & rhythm. No rubs, gallops or murmurs. Lungs: clear Abdomen: soft, nontender, nondistended. No hepatosplenomegaly. No bruits or masses. Good bowel sounds. Extremities: no cyanosis, clubbing, rash, edema Neuro: alert & oriented x 3, cranial nerves grossly intact. moves all 4 extremities w/o difficulty. Affect pleasant.  ECG: Atrial flutter 146bpm,  Cannot rule out Inferior infarct , age undetermined Anterior infarct , age undetermined  Results for orders placed or performed during the hospital encounter of 05/31/18 (from the past 24 hour(s))  CBC with Differential/Platelet     Status: None   Collection Time: 05/31/18   3:43 PM  Result Value Ref Range   WBC 7.2 4.0 - 10.5 K/uL   RBC 4.88 4.22 - 5.81 MIL/uL   Hemoglobin 14.7 13.0 - 17.0 g/dL   HCT 43.3 39.0 - 52.0 %   MCV 88.7 80.0 - 100.0 fL   MCH 30.1 26.0 - 34.0 pg   MCHC 33.9 30.0 - 36.0 g/dL   RDW 12.5 11.5 - 15.5 %   Platelets 226 150 - 400 K/uL   nRBC 0.0 0.0 - 0.2 %   Neutrophils Relative % 60 %   Neutro Abs 4.2 1.7 - 7.7 K/uL   Lymphocytes Relative 26 %   Lymphs Abs 1.9 0.7 - 4.0 K/uL   Monocytes Relative 11 %   Monocytes Absolute 0.8 0.1 - 1.0 K/uL   Eosinophils Relative 3 %   Eosinophils Absolute 0.2 0.0 - 0.5 K/uL   Basophils Relative 0 %   Basophils Absolute 0.0 0.0 - 0.1 K/uL   Immature Granulocytes 0 %   Abs Immature Granulocytes 0.02 0.00 - 0.07 K/uL  Comprehensive metabolic panel     Status: Abnormal   Collection Time: 05/31/18  3:43 PM  Result Value Ref Range   Sodium 140 135 - 145 mmol/L   Potassium 3.4 (L) 3.5 - 5.1 mmol/L   Chloride 104 98 - 111 mmol/L   CO2 26 22 - 32 mmol/L   Glucose, Bld 110 (H) 70 - 99 mg/dL   BUN 18 8 - 23 mg/dL   Creatinine, Ser 0.87 0.61 - 1.24 mg/dL   Calcium 8.7 (L) 8.9 - 10.3 mg/dL   Total Protein 7.1 6.5 - 8.1 g/dL   Albumin 4.3 3.5 - 5.0 g/dL   AST 30 15 - 41 U/L   ALT 35 0 - 44 U/L   Alkaline Phosphatase 43 38 - 126 U/L   Total Bilirubin 0.8 0.3 - 1.2 mg/dL   GFR calc non Af Amer >60 >60 mL/min   GFR calc Af Amer >60 >60 mL/min   Anion gap 10 5 - 15  Magnesium     Status: None   Collection Time: 05/31/18  5:00 PM  Result Value Ref Range   Magnesium 2.0 1.7 - 2.4 mg/dL  Glucose, capillary     Status: Abnormal   Collection Time: 05/31/18  5:09 PM  Result Value Ref Range   Glucose-Capillary 113 (H) 70 - 99 mg/dL   Comment 1 Document in Chart   Glucose, capillary  Status: Abnormal   Collection Time: 06/01/18  7:51 AM  Result Value Ref Range   Glucose-Capillary 112 (H) 70 - 99 mg/dL  Basic metabolic panel     Status: Abnormal   Collection Time: 06/01/18  7:53 AM  Result Value  Ref Range   Sodium 138 135 - 145 mmol/L   Potassium 3.6 3.5 - 5.1 mmol/L   Chloride 104 98 - 111 mmol/L   CO2 25 22 - 32 mmol/L   Glucose, Bld 124 (H) 70 - 99 mg/dL   BUN 18 8 - 23 mg/dL   Creatinine, Ser 0.63 0.61 - 1.24 mg/dL   Calcium 8.7 (L) 8.9 - 10.3 mg/dL   GFR calc non Af Amer >60 >60 mL/min   GFR calc Af Amer >60 >60 mL/min   Anion gap 9 5 - 15  CBC     Status: None   Collection Time: 06/01/18  7:53 AM  Result Value Ref Range   WBC 6.5 4.0 - 10.5 K/uL   RBC 4.73 4.22 - 5.81 MIL/uL   Hemoglobin 14.3 13.0 - 17.0 g/dL   HCT 42.2 39.0 - 52.0 %   MCV 89.2 80.0 - 100.0 fL   MCH 30.2 26.0 - 34.0 pg   MCHC 33.9 30.0 - 36.0 g/dL   RDW 12.6 11.5 - 15.5 %   Platelets 209 150 - 400 K/uL   nRBC 0.0 0.0 - 0.2 %   Dg Chest Portable 1 View  Result Date: 05/31/2018 CLINICAL DATA:  Tachycardia today. EXAM: PORTABLE CHEST 1 VIEW COMPARISON:  None. FINDINGS: Cardiac silhouette is normal in size. No mediastinal or hilar masses. No evidence of adenopathy. Lungs are clear.  No convincing pleural effusion.  No pneumothorax. Skeletal structures are grossly intact. IMPRESSION: No active disease. Electronically Signed   By: Lajean Manes M.D.   On: 05/31/2018 16:09     ASSESSMENT AND PLAN:  Atrial flutter with RVR: Pt remains in atrial flutter with cardizem drip. Will stop cardizem drip and start amiodarone drip.   Pt is [place]- VASc score 2 for hypertension and history of type 2 diabetes, pt had blood in stool and was scheduled for colonoscopy when the heart rate was noted. We will hold of on anticoagulation for now and consider starting when colonoscopy is done and clear.    Echo done, pending reading by Dr. Humphrey Rolls.  Jake Bathe, NP-C Cell: [phone]

## 2018-06-01 NOTE — Progress Notes (Signed)
*  PRELIMINARY RESULTS* Echocardiogram 2D Echocardiogram has been performed.  David Anderson 06/01/2018, 10:03 AM

## 2018-06-01 NOTE — Consult Note (Signed)
  Amiodarone Drug - Drug Interaction Consult Note Pharmacy to identify drug-drug interactions and recommend medication adjustments for those altered by adding amiodarone to the medication regimen.  Recommendations: Pharmacy has reviewed patient's medication orders and have no  recommended adjustments at this time.   Amiodarone is metabolized by the cytochrome P450 system and therefore has the potential to cause many drug interactions. Amiodarone has an average plasma half-life of 50 days (range 20 to 100 days).   There is potential for drug interactions to occur several weeks or months after stopping treatment and the onset of drug interactions may be slow after initiating amiodarone.   [x]  Statins: Increased risk of myopathy. Simvastatin- restrict dose to 20mg  daily. Other statins: counsel patients to report any muscle pain or weakness immediately.  []  Anticoagulants: Amiodarone can increase anticoagulant effect. Consider warfarin dose reduction. Patients should be monitored closely and the dose of anticoagulant altered accordingly, remembering that amiodarone levels take several weeks to stabilize.  []  Antiepileptics: Amiodarone can increase plasma concentration of phenytoin, the dose should be reduced. Note that small changes in phenytoin dose can result in large changes in levels. Monitor patient and counsel on signs of toxicity.  []  Beta blockers: increased risk of bradycardia, AV block and myocardial depression. Sotalol - avoid concomitant use.  []   Calcium channel blockers (diltiazem and verapamil): increased risk of bradycardia, AV block and myocardial depression.  []   Cyclosporine: Amiodarone increases levels of cyclosporine. Reduced dose of cyclosporine is recommended.  []  Digoxin dose should be halved when amiodarone is started.  []  Diuretics: increased risk of cardiotoxicity if hypokalemia occurs.  []  Oral hypoglycemic agents (glyburide, glipizide, glimepiride): increased risk  of hypoglycemia. Patient's glucose levels should be monitored closely when initiating amiodarone therapy.   []  Drugs that prolong the QT interval:  Torsades de pointes risk may be increased with concurrent use - avoid if possible.  Monitor QTc, also keep magnesium/potassium WNL if concurrent therapy can't be avoided. Marland Kitchen Antibiotics: e.g. fluoroquinolones, erythromycin. . Antiarrhythmics: e.g. quinidine, procainamide, disopyramide, sotalol. . Antipsychotics: e.g. phenothiazines, haloperidol.  . Lithium, tricyclic antidepressants, and methadone. Thank You,   Pernell Dupre, PharmD, BCPS Clinical Pharmacist 06/01/2018 12:38 PM

## 2018-06-01 NOTE — ED Notes (Signed)
ED TO INPATIENT HANDOFF REPORT  ED Nurse Name and Phone #: [phone]  S Name/Age/Gender David Anderson 63 y.o. male Room/Bed: ED36A/ED36A  Code Status   Code Status: DNR  Home/SNF/Other Home Patient oriented to: self, place, time and situation Is this baseline? Yes   Triage Complete: Triage complete  Chief Complaint Rapid Heart Rate  Triage Note Pt in endo for colonoscopy and found to have HR 140's. Metoprolol 5 mg given without effect. Dr Clayborn Bigness to bedside and adenosin  6mg  and 12 mg given without result. Pt transported to ED. HR 140s ST. Pt denies cardiac hx.   Allergies No Known Allergies  Level of Care/Admitting Diagnosis ED Disposition    ED Disposition Condition [facility] Area: [place] [[id number]]  Level of Care: Telemetry [5]  Diagnosis: Atrial flutter ([place]) [427.32.ICD-9-CM]  Admitting Physician: Hillary Bow [[id number]]  Attending Physician: Hillary Bow [[id number]]  Estimated length of stay: past midnight tomorrow  Certification:: I certify this patient will need inpatient services for at least 2 midnights  PT Class (Do Not Modify): Inpatient [101]  PT Acc Code (Do Not Modify): Private [1]       B Medical/Surgery History Past Medical History:  Diagnosis Date  . Arthritis    oa  . COPD (chronic obstructive pulmonary disease) ([place])   . History of blood transfusion 1973  . Hyperlipidemia   . Hypertension   . Pneumonia years ago   walking pneumonia  . Shortness of breath dyspnea    with exertion   Past Surgical History:  Procedure Laterality Date  . BACK SURGERY  1991   lower back  . COLONOSCOPY WITH PROPOFOL N/A 05/31/2018   Procedure: COLONOSCOPY WITH PROPOFOL;  Surgeon: Toledo, Benay Pike, MD;  Location: [facility] ENDOSCOPY;  Service: Gastroenterology;  Laterality: N/A;  . left shoulder rotator cuff repair  2001  . surgery for staph infection  1973   both legs left worse than right, both legs open and drained  .  TOTAL HIP ARTHROPLASTY Left 02/11/2015   Procedure: TOTAL HIP ARTHROPLASTY ANTERIOR APPROACH;  Surgeon: Gaynelle Arabian, MD;  Location: [facility];  Service: Orthopedics;  Laterality: Left;     A IV Location/Drains/Wounds Patient Lines/Drains/Airways Status   Active Line/Drains/Airways    Name:   Placement date:   Placement time:   Site:   Days:   Peripheral IV 05/31/18 Right Antecubital   05/31/18    1318    Antecubital   1   Peripheral IV 05/31/18 Left Hand   05/31/18    1600    Hand   1   Airway   02/11/15    1431     1206   Incision (Closed) 02/11/15 Hip Left   02/11/15    1606     1206          Intake/Output Last 24 hours  Intake/Output Summary (Last 24 hours) at 06/01/2018 1656 Last data filed at 06/01/2018 1552 Gross per 24 hour  Intake 338.16 ml  Output -  Net 338.16 ml    Labs/Imaging Results for orders placed or performed during the hospital encounter of 05/31/18 (from the past 48 hour(s))  CBC with Differential/Platelet     Status: None   Collection Time: 05/31/18  3:43 PM  Result Value Ref Range   WBC 7.2 4.0 - 10.5 K/uL   RBC 4.88 4.22 - 5.81 MIL/uL   Hemoglobin 14.7 13.0 - 17.0 g/dL   HCT 43.3 39.0 - 52.0 %  MCV 88.7 80.0 - 100.0 fL   MCH 30.1 26.0 - 34.0 pg   MCHC 33.9 30.0 - 36.0 g/dL   RDW 12.5 11.5 - 15.5 %   Platelets 226 150 - 400 K/uL   nRBC 0.0 0.0 - 0.2 %   Neutrophils Relative % 60 %   Neutro Abs 4.2 1.7 - 7.7 K/uL   Lymphocytes Relative 26 %   Lymphs Abs 1.9 0.7 - 4.0 K/uL   Monocytes Relative 11 %   Monocytes Absolute 0.8 0.1 - 1.0 K/uL   Eosinophils Relative 3 %   Eosinophils Absolute 0.2 0.0 - 0.5 K/uL   Basophils Relative 0 %   Basophils Absolute 0.0 0.0 - 0.1 K/uL   Immature Granulocytes 0 %   Abs Immature Granulocytes 0.02 0.00 - 0.07 K/uL    Comment: Performed at [facility], [address] [place], [place] [address]  Comprehensive metabolic panel     Status: Abnormal   Collection Time: 05/31/18  3:43 PM  Result Value Ref  Range   Sodium 140 135 - 145 mmol/L   Potassium 3.4 (L) 3.5 - 5.1 mmol/L   Chloride 104 98 - 111 mmol/L   CO2 26 22 - 32 mmol/L   Glucose, Bld 110 (H) 70 - 99 mg/dL   BUN 18 8 - 23 mg/dL   Creatinine, Ser 0.87 0.61 - 1.24 mg/dL   Calcium 8.7 (L) 8.9 - 10.3 mg/dL   Total Protein 7.1 6.5 - 8.1 g/dL   Albumin 4.3 3.5 - 5.0 g/dL   AST 30 15 - 41 U/L   ALT 35 0 - 44 U/L   Alkaline Phosphatase 43 38 - 126 U/L   Total Bilirubin 0.8 0.3 - 1.2 mg/dL   GFR calc non Af Amer >60 >60 mL/min   GFR calc Af Amer >60 >60 mL/min   Anion gap 10 5 - 15    Comment: Performed at [facility], [address] [place], [place] [address]  Magnesium     Status: None   Collection Time: 05/31/18  5:00 PM  Result Value Ref Range   Magnesium 2.0 1.7 - 2.4 mg/dL    Comment: Performed at [facility], [address] [place], [place] [address]  Glucose, capillary     Status: Abnormal   Collection Time: 05/31/18  5:09 PM  Result Value Ref Range   Glucose-Capillary 113 (H) 70 - 99 mg/dL   Comment 1 Document in Chart   Glucose, capillary     Status: Abnormal   Collection Time: 06/01/18  7:51 AM  Result Value Ref Range   Glucose-Capillary 112 (H) 70 - 99 mg/dL  Basic metabolic panel     Status: Abnormal   Collection Time: 06/01/18  7:53 AM  Result Value Ref Range   Sodium 138 135 - 145 mmol/L   Potassium 3.6 3.5 - 5.1 mmol/L   Chloride 104 98 - 111 mmol/L   CO2 25 22 - 32 mmol/L   Glucose, Bld 124 (H) 70 - 99 mg/dL   BUN 18 8 - 23 mg/dL   Creatinine, Ser 0.63 0.61 - 1.24 mg/dL   Calcium 8.7 (L) 8.9 - 10.3 mg/dL   GFR calc non Af Amer >60 >60 mL/min   GFR calc Af Amer >60 >60 mL/min   Anion gap 9 5 - 15    Comment: Performed at [facility], [address] [place], [place] [address]  CBC     Status: None  Collection Time: 06/01/18  7:53 AM  Result Value Ref Range   WBC 6.5 4.0 - 10.5 K/uL   RBC 4.73 4.22 - 5.81 MIL/uL   Hemoglobin 14.3 13.0 - 17.0 g/dL   HCT 42.2 39.0  - 52.0 %   MCV 89.2 80.0 - 100.0 fL   MCH 30.2 26.0 - 34.0 pg   MCHC 33.9 30.0 - 36.0 g/dL   RDW 12.6 11.5 - 15.5 %   Platelets 209 150 - 400 K/uL   nRBC 0.0 0.0 - 0.2 %    Comment: Performed at [facility], [address] [place], [place] [address]  Glucose, capillary     Status: Abnormal   Collection Time: 06/01/18 11:31 AM  Result Value Ref Range   Glucose-Capillary 137 (H) 70 - 99 mg/dL   Dg Chest Portable 1 View  Result Date: 05/31/2018 CLINICAL DATA:  Tachycardia today. EXAM: PORTABLE CHEST 1 VIEW COMPARISON:  None. FINDINGS: Cardiac silhouette is normal in size. No mediastinal or hilar masses. No evidence of adenopathy. Lungs are clear.  No convincing pleural effusion.  No pneumothorax. Skeletal structures are grossly intact. IMPRESSION: No active disease. Electronically Signed   By: Lajean Manes M.D.   On: 05/31/2018 16:09    Pending Labs Unresulted Labs (From admission, onward)    Start     Ordered   06/02/18 0500  Magnesium  Tomorrow morning,   STAT     06/01/18 1028   06/02/18 1017  Basic metabolic panel  Daily,   STAT     06/01/18 1028   05/31/18 1636  HIV antibody (Routine Testing)  Add-on,   AD     05/31/18 1637   05/31/18 1635  Hemoglobin A1c  Add-on,   AD    Comments:  To assess prior glycemic control    05/31/18 1637          Vitals/Pain Today's Vitals   06/01/18 1000 06/01/18 1205 06/01/18 1329 06/01/18 1602  BP: 107/74 105/68 114/79 121/74  Pulse: (!) 112 84 96 74  Resp: 18 17 16  (!) 22  Temp:      TempSrc:      SpO2: 92% 94% 97% 96%  Weight:      Height:      PainSc:   0-No pain 0-No pain    Isolation Precautions No active isolations  Medications Medications  0.9 %  sodium chloride infusion (250 mLs Intravenous New Bag/Given 06/01/18 1602)  sodium chloride flush (NS) 0.9 % injection 3 mL (3 mLs Intravenous Not Given 06/01/18 1031)  acetaminophen (TYLENOL) tablet 650 mg (has no administration in time range)    Or  acetaminophen  (TYLENOL) suppository 650 mg (has no administration in time range)  polyethylene glycol (MIRALAX / GLYCOLAX) packet 17 g (has no administration in time range)  albuterol (PROVENTIL) (2.5 MG/3ML) 0.083% nebulizer solution 2.5 mg (has no administration in time range)  aspirin chewable tablet 81 mg (81 mg Oral Given 06/01/18 1023)  pravastatin (PRAVACHOL) tablet 80 mg (80 mg Oral Given 06/01/18 0158)  traZODone (DESYREL) tablet 50 mg (has no administration in time range)  cyclobenzaprine (FLEXERIL) tablet 10 mg (has no administration in time range)  loratadine (CLARITIN) tablet 10 mg (10 mg Oral Given 06/01/18 1023)  fluticasone (FLONASE) 50 MCG/ACT nasal spray 1 spray (1 spray Each Nare Given 06/01/18 1024)  tiotropium (SPIRIVA) inhalation capsule ([facility] use ONLY) 18 mcg (18 mcg Inhalation Given 06/01/18 1025)  insulin aspart (novoLOG) injection 0-9 Units (0 Units Subcutaneous Not  Given 06/01/18 1202)  amiodarone (NEXTERONE PREMIX) 360-4.14 MG/200ML-% (1.8 mg/mL) IV infusion (60 mg/hr Intravenous New Bag/Given 06/01/18 1600)    Followed by  amiodarone (NEXTERONE PREMIX) 360-4.14 MG/200ML-% (1.8 mg/mL) IV infusion (has no administration in time range)  magnesium sulfate IVPB 2 g 50 mL (0 g Intravenous Stopped 05/31/18 1611)  diltiazem (CARDIZEM) 1 mg/mL load via infusion 15 mg (15 mg Intravenous Bolus from Bag 05/31/18 1610)  potassium chloride SA (K-DUR,KLOR-CON) CR tablet 40 mEq (40 mEq Oral Given 05/31/18 1635)  metoprolol tartrate (LOPRESSOR) injection 10 mg (10 mg Intravenous Given 05/31/18 1715)  amiodarone (NEXTERONE) 1.8 mg/mL load via infusion 150 mg (150 mg Intravenous Bolus from Bag 06/01/18 1129)    Mobility walks Low fall risk   Focused Assessments Cardiac Assessment Handoff:    No results found for: CKTOTAL, CKMB, CKMBINDEX, TROPONINI No results found for: DDIMER Does the Patient currently have chest pain? No      R Recommendations: See Admitting Provider Note  Report given to:    Additional Notes:  Pt did not have colonoscopy before being transferred to ED.

## 2018-06-01 NOTE — Progress Notes (Signed)
[facility] [place] NAME: David Anderson    MR#:  [id number]  DATE OF BIRTH:  1955-07-21  SUBJECTIVE:  patient came in with atrial flutter/tachycardia in the emergency room while he was trying to get his outpatient colonoscopy done. He was started initially on Cardizem drip now on amiodarone drip. Heart rate still in the 110's  REVIEW OF SYSTEMS:   Review of Systems  Constitutional: Negative for chills, fever and weight loss.  HENT: Negative for ear discharge, ear pain and nosebleeds.   Eyes: Negative for blurred vision, pain and discharge.  Respiratory: Negative for sputum production, shortness of breath, wheezing and stridor.   Cardiovascular: Positive for palpitations. Negative for chest pain, orthopnea and PND.  Gastrointestinal: Negative for abdominal pain, diarrhea, nausea and vomiting.  Genitourinary: Negative for frequency and urgency.  Musculoskeletal: Negative for back pain and joint pain.  Neurological: Positive for weakness. Negative for sensory change, speech change and focal weakness.  Psychiatric/Behavioral: Negative for depression and hallucinations. The patient is not nervous/anxious.    Tolerating Diet:yes Tolerating PT: not needed  DRUG ALLERGIES:  No Known Allergies  VITALS:  Blood pressure 114/79, pulse 96, temperature 98.1 F (36.7 C), temperature source Oral, resp. rate 16, height 5\' 9"  (1.753 m), weight 82 kg, SpO2 97 %.  PHYSICAL EXAMINATION:   Physical Exam  GENERAL:  63 y.o.-year-old patient lying in the bed with no acute distress.  EYES: Pupils equal, round, reactive to light and accommodation. No scleral icterus. Extraocular muscles intact.  HEENT: Head atraumatic, normocephalic. Oropharynx and nasopharynx clear.  NECK:  Supple, no jugular venous distention. No thyroid enlargement, no tenderness.  LUNGS: Normal breath sounds bilaterally, no wheezing, rales, rhonchi. No use of accessory muscles of  respiration.  CARDIOVASCULAR: S1, S2 normal. No murmurs, rubs, or gallops. Tachycardia ABDOMEN: Soft, nontender, nondistended. Bowel sounds present. No organomegaly or mass.  EXTREMITIES: No cyanosis, clubbing or edema b/l.    NEUROLOGIC: Cranial nerves II through XII are intact. No focal Motor or sensory deficits b/l.   PSYCHIATRIC:  patient is alert and oriented x 3.  SKIN: No obvious rash, lesion, or ulcer.   LABORATORY PANEL:  CBC Recent Labs  Lab 06/01/18 0753  WBC 6.5  HGB 14.3  HCT 42.2  PLT 209    Chemistries  Recent Labs  Lab 05/31/18 1543 05/31/18 1700 06/01/18 0753  NA 140  --  138  K 3.4*  --  3.6  CL 104  --  104  CO2 26  --  25  GLUCOSE 110*  --  124*  BUN 18  --  18  CREATININE 0.87  --  0.63  CALCIUM 8.7*  --  8.7*  MG  --  2.0  --   AST 30  --   --   ALT 35  --   --   ALKPHOS 43  --   --   BILITOT 0.8  --   --    Cardiac Enzymes No results for input(s): TROPONINI in the last 168 hours. RADIOLOGY:  Dg Chest Portable 1 View  Result Date: 05/31/2018 CLINICAL DATA:  Tachycardia today. EXAM: PORTABLE CHEST 1 VIEW COMPARISON:  None. FINDINGS: Cardiac silhouette is normal in size. No mediastinal or hilar masses. No evidence of adenopathy. Lungs are clear.  No convincing pleural effusion.  No pneumothorax. Skeletal structures are grossly intact. IMPRESSION: No active disease. Electronically Signed   By: Lajean Manes M.D.   On: 05/31/2018 16:09  ASSESSMENT AND PLAN:   David Anderson  is a 63 y.o. male with a known history of diabetes mellitus, COPD, tobacco use presented to the hospital for routine colonoscopy after a clinic stool card was positive for occult blood.  Patient was found to be tachycardic.  Was taken to cardiology Dr. Etta Quill office.  He was given 2 doses of adenosine and metoprolol with no improvement.  Sent to the emergency room  * Atrial flutter with rapid ventricular rate--NEW -On Cardizem drip with heart rate in the 140s.-- Now on IV  amiodarone drip per Dr. Yancey Flemings -Not starting any anticoagulation due to occult blood in stool card found.   -Will wait for cardiology input-- recommends anticoagulation after G.I. workup is completed done as outpatient -Echo ordered  *COPD.  Has chronic mild wheezing which is stable.  Nebulizers ordered  *Stool positive for occult blood.  This was on routine stool card test in the office.  Will need colonoscopy once stabilized.  As outpatient.  * Tobacco use Counseled to quit > 3 minutes  *DVT prophylaxis with SCDs  CODE STATUS: *full  DVT Prophylaxis: SCD  TOTAL TIME TAKING CARE OF THIS PATIENT: *30* minutes.  >50% time spent on counselling and coordination of care  POSSIBLE D/C IN *1 to 2* DAYS, DEPENDING ON CLINICAL CONDITION.  Note: This dictation was prepared with Dragon dictation along with smaller phrase technology. Any transcriptional errors that result from this process are unintentional.  Fritzi Mandes M.D on 06/01/2018 at 2:42 PM  Between 7am to 6pm - Pager - [phone]  After 6pm go to [email or web address] - password [email or web address] [facility]  Office  [phone]  CC: Primary care physician; David Anderson, MDPatient ID: David Anderson, male   DOB: 06/07/1955, 63 y.o.   MRN: [id number]

## 2018-06-01 NOTE — ED Notes (Signed)
Pt rate noted at 14 0bpm, drip titrated back to 38ml/hr

## 2018-06-01 NOTE — ED Notes (Signed)
Patient's HR now at 140. Cardizem drip increased from 10mg /hr to 15mg /hr per order for titration. No other needs at this time.

## 2018-06-01 NOTE — ED Notes (Addendum)
Lunch tray delivered. Pt sleeping with audible respirations. Lights dimmed and meal tray placed at bedside. No family at bedside at this time. Bed locked and call bell in reach.

## 2018-06-02 LAB — GLUCOSE, CAPILLARY
Glucose-Capillary: 108 mg/dL — ABNORMAL HIGH (ref 70–99)
Glucose-Capillary: 86 mg/dL (ref 70–99)
Glucose-Capillary: 110 mg/dL — ABNORMAL HIGH (ref 70–99)
Glucose-Capillary: 126 mg/dL — ABNORMAL HIGH (ref 70–99)

## 2018-06-02 LAB — BASIC METABOLIC PANEL
Anion gap: 10 (ref 5–15)
BUN: 19 mg/dL (ref 8–23)
CO2: 25 mmol/L (ref 22–32)
Calcium: 8.7 mg/dL — ABNORMAL LOW (ref 8.9–10.3)
Chloride: 105 mmol/L (ref 98–111)
Creatinine, Ser: 0.73 mg/dL (ref 0.61–1.24)
GFR calc Af Amer: >60 mL/min (ref >60)
GFR calc non Af Amer: >60 mL/min (ref >60)
Glucose, Bld: 126 mg/dL — ABNORMAL HIGH (ref 70–99)
Potassium: 3.6 mmol/L (ref 3.5–5.1)
Sodium: 140 mmol/L (ref 135–145)

## 2018-06-02 LAB — ECHOCARDIOGRAM COMPLETE
Height: 69 in
Weight: 2892.44 oz

## 2018-06-02 LAB — MAGNESIUM: Magnesium: 2 mg/dL (ref 1.7–2.4)

## 2018-06-02 MED ORDER — INSULIN ASPART 100 UNIT/ML ~~LOC~~ SOLN: 15.0000 [IU] | Freq: Once | SUBCUTANEOUS | Status: DC

## 2018-06-02 MED ORDER — AMIODARONE HCL IN DEXTROSE 360-4.14 MG/200ML-% IV SOLN
INTRAVENOUS | Status: AC
  Administered 2018-06-02: 13:00:00
  Filled 2018-06-02: qty 200

## 2018-06-02 MED ORDER — AMIODARONE HCL IN DEXTROSE 360-4.14 MG/200ML-% IV SOLN
30.0000 mg/h | INTRAVENOUS | Status: DC
  Administered 2018-06-02 – 2018-06-03 (×2): 30 mg/h via INTRAVENOUS
  Filled 2018-06-02 (×2): qty 200

## 2018-06-02 MED ORDER — AMIODARONE HCL 200 MG PO TABS
400.0000 mg | ORAL_TABLET | Freq: Every day | ORAL | Status: DC
  Administered 2018-06-02: 400 mg via ORAL
  Filled 2018-06-02: qty 2

## 2018-06-02 MED ORDER — METOPROLOL TARTRATE 50 MG PO TABS
50.0000 mg | ORAL_TABLET | Freq: Two times a day (BID) | ORAL | Status: DC
  Administered 2018-06-02 (×2): 50 mg via ORAL
  Filled 2018-06-02 (×2): qty 1

## 2018-06-02 NOTE — Progress Notes (Signed)
Still has atrial flutter

## 2018-06-02 NOTE — Progress Notes (Addendum)
SUBJECTIVE: Pt is feeling well, continues to be asymptomatic.    Vitals:   06/01/18 2210 06/02/18 0017 06/02/18 0446 06/02/18 0752  BP:  125/85 (!) 134/102 (!) 134/97  Pulse: (!) 105 (!) 121 (!) 131 (!) 133  Resp:      Temp:   98.2 F (36.8 C) 97.8 F (36.6 C)  TempSrc:   Oral Oral  SpO2:  96% 98% 96%  Weight:   82.1 kg   Height:        Intake/Output Summary (Last 24 hours) at 06/02/2018 0857 Last data filed at 06/02/2018 0445 Gross per 24 hour  Intake 491.72 ml  Output 1125 ml  Net -633.28 ml    LABS: Basic Metabolic Panel: Recent Labs    05/31/18 1700 06/01/18 0753 06/02/18 0458  NA  --  138 140  K  --  3.6 3.6  CL  --  104 105  CO2  --  25 25  GLUCOSE  --  124* 126*  BUN  --  18 19  CREATININE  --  0.63 0.73  CALCIUM  --  8.7* 8.7*  MG 2.0  --  2.0   Liver Function Tests: Recent Labs    05/31/18 1543  AST 30  ALT 35  ALKPHOS 43  BILITOT 0.8  PROT 7.1  ALBUMIN 4.3   No results for input(s): LIPASE, AMYLASE in the last 72 hours. CBC: Recent Labs    05/31/18 1543 06/01/18 0753  WBC 7.2 6.5  NEUTROABS 4.2  --   HGB 14.7 14.3  HCT 43.3 42.2  MCV 88.7 89.2  PLT 226 209   Cardiac Enzymes: No results for input(s): CKTOTAL, CKMB, CKMBINDEX, TROPONINI in the last 72 hours. BNP: Invalid input(s): POCBNP D-Dimer: No results for input(s): DDIMER in the last 72 hours. Hemoglobin A1C: Recent Labs    06/01/18 0753  HGBA1C 6.1*   Fasting Lipid Panel: No results for input(s): CHOL, HDL, LDLCALC, TRIG, CHOLHDL, LDLDIRECT in the last 72 hours. Thyroid Function Tests: No results for input(s): TSH, T4TOTAL, T3FREE, THYROIDAB in the last 72 hours.  Invalid input(s): FREET3 Anemia Panel: No results for input(s): VITAMINB12, FOLATE, FERRITIN, TIBC, IRON, RETICCTPCT in the last 72 hours.   PHYSICAL EXAM General: Well developed, well nourished, in no acute distress HEENT:  Normocephalic and atramatic Neck:  No JVD.  Lungs: Clear bilaterally to  auscultation and percussion. Heart: HRRR . Normal S1 and S2 without gallops or murmurs.  Abdomen: Bowel sounds are positive, abdomen soft and non-tender  Msk:  Back normal, normal gait. Normal strength and tone for age. Extremities: No clubbing, cyanosis or edema.   Neuro: Alert and oriented X 3. Psych:  Good affect, responds appropriately  TELEMETRY: EKG shows atrial flutter 132bpm  ASSESSMENT AND PLAN: Atrial flutter with RVR: Continues in atrial flutter, continue amiodarone drip today. Will increase metoprolol as tolerated, currently at 50mg  BID. Echo pending reading by Dr. Humphrey Rolls.   Active Problems:   Atrial flutter ([place])    Jake Bathe, NP-C 06/02/2018 8:57 AM Cell: [phone]

## 2018-06-02 NOTE — Progress Notes (Signed)
Pt remains on amiodarone gtt and appears to have converted to ST with HR sustained in 130s. MD notified and orders received for EKG and metoprolol 50mg  BID. I will continue to assess.

## 2018-06-02 NOTE — Progress Notes (Signed)
[facility] [place] NAME: David Anderson    MR#:  [id number]  DATE OF BIRTH:  June 18, 1955  SUBJECTIVE:   now on amiodarone drip. Heart rate still in the 110's--130's Denies any complaints REVIEW OF SYSTEMS:   Review of Systems  Constitutional: Negative for chills, fever and weight loss.  HENT: Negative for ear discharge, ear pain and nosebleeds.   Eyes: Negative for blurred vision, pain and discharge.  Respiratory: Negative for sputum production, shortness of breath, wheezing and stridor.   Cardiovascular: Positive for palpitations. Negative for chest pain, orthopnea and PND.  Gastrointestinal: Negative for abdominal pain, diarrhea, nausea and vomiting.  Genitourinary: Negative for frequency and urgency.  Musculoskeletal: Negative for back pain and joint pain.  Neurological: Positive for weakness. Negative for sensory change, speech change and focal weakness.  Psychiatric/Behavioral: Negative for depression and hallucinations. The patient is not nervous/anxious.    Tolerating Diet:yes Tolerating PT: not needed  DRUG ALLERGIES:  No Known Allergies  VITALS:  Blood pressure (!) 134/97, pulse (!) 133, temperature 97.8 F (36.6 C), temperature source Oral, resp. rate 20, height 5\' 9"  (1.753 m), weight 82.1 kg, SpO2 96 %.  PHYSICAL EXAMINATION:   Physical Exam  GENERAL:  63 y.o.-year-old patient lying in the bed with no acute distress.  EYES: Pupils equal, round, reactive to light and accommodation. No scleral icterus. Extraocular muscles intact.  HEENT: Head atraumatic, normocephalic. Oropharynx and nasopharynx clear.  NECK:  Supple, no jugular venous distention. No thyroid enlargement, no tenderness.  LUNGS: Normal breath sounds bilaterally, no wheezing, rales, rhonchi. No use of accessory muscles of respiration.  CARDIOVASCULAR: S1, S2 normal. No murmurs, rubs, or gallops. Tachycardia ABDOMEN: Soft, nontender, nondistended. Bowel  sounds present. No organomegaly or mass.  EXTREMITIES: No cyanosis, clubbing or edema b/l.    NEUROLOGIC: Cranial nerves II through XII are intact. No focal Motor or sensory deficits b/l.   PSYCHIATRIC:  patient is alert and oriented x 3.  SKIN: No obvious rash, lesion, or ulcer.   LABORATORY PANEL:  CBC Recent Labs  Lab 06/01/18 0753  WBC 6.5  HGB 14.3  HCT 42.2  PLT 209    Chemistries  Recent Labs  Lab 05/31/18 1543  06/02/18 0458  NA 140   < > 140  K 3.4*   < > 3.6  CL 104   < > 105  CO2 26   < > 25  GLUCOSE 110*   < > 126*  BUN 18   < > 19  CREATININE 0.87   < > 0.73  CALCIUM 8.7*   < > 8.7*  MG  --    < > 2.0  AST 30  --   --   ALT 35  --   --   ALKPHOS 43  --   --   BILITOT 0.8  --   --    < > = values in this interval not displayed.   Cardiac Enzymes No results for input(s): TROPONINI in the last 168 hours. RADIOLOGY:  Dg Chest Portable 1 View  Result Date: 05/31/2018 CLINICAL DATA:  Tachycardia today. EXAM: PORTABLE CHEST 1 VIEW COMPARISON:  None. FINDINGS: Cardiac silhouette is normal in size. No mediastinal or hilar masses. No evidence of adenopathy. Lungs are clear.  No convincing pleural effusion.  No pneumothorax. Skeletal structures are grossly intact. IMPRESSION: No active disease. Electronically Signed   By: Lajean Manes M.D.   On: 05/31/2018 16:09   ASSESSMENT  AND PLAN:   David Anderson  is a 63 y.o. male with a known history of diabetes mellitus, COPD, tobacco use presented to the hospital for routine colonoscopy after a clinic stool card was positive for occult blood.  Patient was found to be tachycardic.  Was taken to cardiology Dr. Etta Quill office.  He was given 2 doses of adenosine and metoprolol with no improvement.  Sent to the emergency room  * Atrial flutter/fibirllation with rapid ventricular rate--NEW -On Cardizem drip with heart rate in the 140s.-- Now on IV amiodarone drip per Dr. Yancey Flemings -Not starting any anticoagulation due to occult blood  in stool card found.   -Will wait for cardiology input-- recommends anticoagulation after G.I. workup is completed done as outpatient -Echo ordered -Added Metoprolol 50 mg bid  *COPD.  Has chronic mild wheezing which is stable.  Nebulizers ordered  *Stool positive for occult blood.  This was on routine stool card test in the office.  Will need colonoscopy once stabilized.  As outpatient.  * Tobacco use Counseled to quit > 3 minutes  *DVT prophylaxis with SCDs  CODE STATUS: *full  DVT Prophylaxis: SCD  TOTAL TIME TAKING CARE OF THIS PATIENT: *30* minutes.  >50% time spent on counselling and coordination of care  POSSIBLE D/C IN *1 to 2* DAYS, DEPENDING ON CLINICAL CONDITION.  Note: This dictation was prepared with Dragon dictation along with smaller phrase technology. Any transcriptional errors that result from this process are unintentional.  Fritzi Mandes M.D on 06/02/2018 at 8:40 AM  Between 7am to 6pm - Pager - [phone]  After 6pm go to [email or web address] - password [email or web address] [facility]  Office  [phone]  CC: Primary care physician; Jodi Marble, MDPatient ID: David Anderson, male   DOB: [birth year], 63 y.o.   MRN: [id number]

## 2018-06-03 ENCOUNTER — Other Ambulatory Visit: Payer: Self-pay | Admitting: Cardiovascular Disease

## 2018-06-03 ENCOUNTER — Encounter: Payer: Self-pay | Admitting: *Deleted

## 2018-06-03 ENCOUNTER — Encounter
Admission: EM | Disposition: A | Discharge status: Home / Self Care | Payer: Self-pay | Source: Home / Self Care | Attending: Internal Medicine

## 2018-06-03 LAB — BASIC METABOLIC PANEL
Anion gap: 9 (ref 5–15)
BUN: 18 mg/dL (ref 8–23)
CO2: 24 mmol/L (ref 22–32)
Calcium: 8.6 mg/dL — ABNORMAL LOW (ref 8.9–10.3)
Chloride: 104 mmol/L (ref 98–111)
Creatinine, Ser: 0.69 mg/dL (ref 0.61–1.24)
GFR calc Af Amer: >60 mL/min (ref >60)
GFR calc non Af Amer: >60 mL/min (ref >60)
Glucose, Bld: 147 mg/dL — ABNORMAL HIGH (ref 70–99)
Potassium: 3.5 mmol/L (ref 3.5–5.1)
Sodium: 137 mmol/L (ref 135–145)

## 2018-06-03 LAB — HIV ANTIBODY (ROUTINE TESTING W REFLEX): HIV SCREEN 4TH GENERATION: NONREACTIVE

## 2018-06-03 LAB — APTT: aPTT: 42 seconds — ABNORMAL HIGH (ref 24–36)

## 2018-06-03 LAB — TROPONIN I
Troponin I: <0.03 ng/mL (ref <0.03)
Troponin I: <0.03 ng/mL (ref <0.03)

## 2018-06-03 LAB — CBC
HCT: 42.8 % (ref 39.0–52.0)
Hemoglobin: 14.5 g/dL (ref 13.0–17.0)
MCH: 30.1 pg (ref 26.0–34.0)
MCHC: 33.9 g/dL (ref 30.0–36.0)
MCV: 88.8 fL (ref 80.0–100.0)
Platelets: 200 10*3/uL (ref 150–400)
RBC: 4.82 MIL/uL (ref 4.22–5.81)
RDW: 12.1 % (ref 11.5–15.5)
WBC: 7.1 10*3/uL (ref 4.0–10.5)
nRBC: 0 % (ref 0.0–0.2)

## 2018-06-03 LAB — GLUCOSE, CAPILLARY
Glucose-Capillary: 112 mg/dL — ABNORMAL HIGH (ref 70–99)
Glucose-Capillary: 120 mg/dL — ABNORMAL HIGH (ref 70–99)
Glucose-Capillary: 137 mg/dL — ABNORMAL HIGH (ref 70–99)
Glucose-Capillary: 168 mg/dL — ABNORMAL HIGH (ref 70–99)

## 2018-06-03 LAB — PROTIME-INR
INR: 1.1 (ref 0.8–1.2)
Prothrombin Time: 14.5 seconds (ref 11.4–15.2)

## 2018-06-03 LAB — HEPARIN LEVEL (UNFRACTIONATED): Heparin Unfractionated: <0.1 IU/mL — ABNORMAL LOW (ref 0.30–0.70)

## 2018-06-03 SURGERY — LEFT HEART CATH
Anesthesia: Moderate Sedation

## 2018-06-03 MED ORDER — AMIODARONE HCL 200 MG PO TABS
400.0000 mg | ORAL_TABLET | Freq: Every day | ORAL | Status: DC
  Administered 2018-06-03 – 2018-06-04 (×2): 400 mg via ORAL
  Filled 2018-06-03 (×2): qty 2

## 2018-06-03 MED ORDER — HEPARIN BOLUS VIA INFUSION
4000.0000 [IU] | Freq: Once | INTRAVENOUS | Status: DC
  Filled 2018-06-03: qty 4000

## 2018-06-03 MED ORDER — POLYETHYLENE GLYCOL 3350 17 G PO PACK
17.0000 g | PACK | Freq: Every day | ORAL | Status: DC
  Filled 2018-06-03 (×2): qty 1

## 2018-06-03 MED ORDER — APIXABAN 5 MG PO TABS
5.0000 mg | ORAL_TABLET | Freq: Two times a day (BID) | ORAL | Status: DC
  Administered 2018-06-03: 5 mg via ORAL
  Filled 2018-06-03: qty 1

## 2018-06-03 MED ORDER — ALUM & MAG HYDROXIDE-SIMETH 200-200-20 MG/5ML PO SUSP
30.0000 mL | Freq: Four times a day (QID) | ORAL | Status: DC
  Administered 2018-06-03 – 2018-06-04 (×4): 30 mL via ORAL
  Filled 2018-06-03 (×5): qty 30

## 2018-06-03 MED ORDER — DOCUSATE SODIUM 100 MG PO CAPS
200.0000 mg | ORAL_CAPSULE | Freq: Two times a day (BID) | ORAL | Status: DC
  Administered 2018-06-03 (×2): 200 mg via ORAL
  Filled 2018-06-03 (×3): qty 2

## 2018-06-03 MED ORDER — HEPARIN (PORCINE) 25000 UT/250ML-% IV SOLN: 1000.0000 [IU]/h | INTRAVENOUS | Status: DC

## 2018-06-03 MED ORDER — HEPARIN (PORCINE) 25000 UT/250ML-% IV SOLN
1000.0000 [IU]/h | INTRAVENOUS | Status: DC
  Administered 2018-06-03: 1000 [IU]/h via INTRAVENOUS
  Filled 2018-06-03: qty 250

## 2018-06-03 MED ORDER — METOPROLOL TARTRATE 50 MG PO TABS
100.0000 mg | ORAL_TABLET | Freq: Two times a day (BID) | ORAL | Status: DC
  Administered 2018-06-03 – 2018-06-04 (×3): 100 mg via ORAL
  Filled 2018-06-03 (×3): qty 2

## 2018-06-03 MED ORDER — DILTIAZEM HCL 60 MG PO TABS
60.0000 mg | ORAL_TABLET | Freq: Three times a day (TID) | ORAL | Status: DC
  Administered 2018-06-03 – 2018-06-04 (×4): 60 mg via ORAL
  Filled 2018-06-03 (×2): qty 1
  Filled 2018-06-03: qty 2
  Filled 2018-06-03 (×3): qty 1
  Filled 2018-06-03 (×2): qty 2

## 2018-06-03 NOTE — Progress Notes (Addendum)
ANTICOAGULATION CONSULT NOTE - Initial Consult  Pharmacy Consult for Heparin Drip Indication: chest pain/ACS and atrial fibrillation  No Known Allergies  Patient Measurements: Height: 5\' 9"  (175.3 cm) Weight: 182 lb (82.6 kg) IBW/kg (Calculated) : 70.7 Heparin Dosing Weight: 82.6 kg  Vital Signs: Temp: 97.5 F (36.4 C) (03/06 0713) Temp Source: Oral (03/06 0713) BP: 126/80 (03/06 0946) Pulse Rate: 122 (03/06 0946)  Labs: Recent Labs    05/31/18 1543 06/01/18 0753 06/02/18 0458 06/03/18 0441  HGB 14.7 14.3  --   --   HCT 43.3 42.2  --   --   PLT 226 209  --   --   CREATININE 0.87 0.63 0.73 0.69    Estimated Creatinine Clearance: 97 mL/min (by C-G formula based on SCr of 0.69 mg/dL).   Medical History: Past Medical History:  Diagnosis Date  . Arthritis    oa  . COPD (chronic obstructive pulmonary disease) ([place])   . History of blood transfusion 1973  . Hyperlipidemia   . Hypertension   . Pneumonia years ago   walking pneumonia  . Shortness of breath dyspnea    with exertion    Assessment: Patient is a 63yo male admitted with Atrial Flutter. Pharmacy consulted to initiate Heparin drip for ACS/STEMI. Per cardiology note they do not believe this to be STEMI but that patient is in coarse atrial fibrillation. Patient was started on Apixaban 5mg  bid this morning, last dose was given on 3/6 at 09:57. Apixaban has been discontinued.   Goal of Therapy:  Heparin level 0.3-0.7 units/ml aPTT 66-102 seconds Monitor platelets by anticoagulation protocol: Yes   Plan:  Have ordered baseline labs including Heparin level. Will begin Heparin drip at 1000 units/hr at 22:00. Will check aPTT and Heparin level at 04:00 on 3/7. Will likely need to use aPTT levels to adjust Heparin until correlation with HL. Daily CBC while on Heparin drip.  Paulina Fusi, PharmD, BCPS 06/03/2018 12:17 PM

## 2018-06-03 NOTE — Care Management Important Message (Signed)
Copy of signed Medicare IM left with patient in room. 

## 2018-06-03 NOTE — Progress Notes (Signed)
[facility]  Date: 06/03/18 Time: 11:22 AM  I was asked to evaluate the patient for possible STEMI.  He was admitted for atrial flutter with plans for d/c home today.  EKG showed possible inferior ST elevation, though the patient was asymptomatic.  Repeat EKG shows no significant ST segment elevation with persistent coarse atrial fibrillation.  David Anderson denies having any chest pain and shortness of breath.  I do not believe that this represents STEMI, and we have agreed to defer cardiac catheterization.  Further management per Dr. Humphrey Rolls.  Nelva Bush, MD [facility] Pager: [phone]

## 2018-06-03 NOTE — Progress Notes (Signed)
SUBJECTIVE: Pt is feeling well, no chest pain or shortness of breath. Eager to go home.    Vitals:   06/02/18 2152 06/03/18 0007 06/03/18 0603 06/03/18 0713  BP:  (!) 131/95 (!) 133/97 (!) 135/98  Pulse: (!) 127 (!) 117 (!) 119 (!) 101  Resp:    19  Temp:   97.8 F (36.6 C) (!) 97.5 F (36.4 C)  TempSrc:   Oral Oral  SpO2:  94% 94% 96%  Weight:   82.6 kg   Height:        Intake/Output Summary (Last 24 hours) at 06/03/2018 0847 Last data filed at 06/03/2018 0606 Gross per 24 hour  Intake 869.38 ml  Output 1950 ml  Net -1080.62 ml    LABS: Basic Metabolic Panel: Recent Labs    05/31/18 1700  06/02/18 0458 06/03/18 0441  NA  --    < > 140 137  K  --    < > 3.6 3.5  CL  --    < > 105 104  CO2  --    < > 25 24  GLUCOSE  --    < > 126* 147*  BUN  --    < > 19 18  CREATININE  --    < > 0.73 0.69  CALCIUM  --    < > 8.7* 8.6*  MG 2.0  --  2.0  --    < > = values in this interval not displayed.   Liver Function Tests: Recent Labs    05/31/18 1543  AST 30  ALT 35  ALKPHOS 43  BILITOT 0.8  PROT 7.1  ALBUMIN 4.3   No results for input(s): LIPASE, AMYLASE in the last 72 hours. CBC: Recent Labs    05/31/18 1543 06/01/18 0753  WBC 7.2 6.5  NEUTROABS 4.2  --   HGB 14.7 14.3  HCT 43.3 42.2  MCV 88.7 89.2  PLT 226 209   Cardiac Enzymes: No results for input(s): CKTOTAL, CKMB, CKMBINDEX, TROPONINI in the last 72 hours. BNP: Invalid input(s): POCBNP D-Dimer: No results for input(s): DDIMER in the last 72 hours. Hemoglobin A1C: Recent Labs    06/01/18 0753  HGBA1C 6.1*   Fasting Lipid Panel: No results for input(s): CHOL, HDL, LDLCALC, TRIG, CHOLHDL, LDLDIRECT in the last 72 hours. Thyroid Function Tests: No results for input(s): TSH, T4TOTAL, T3FREE, THYROIDAB in the last 72 hours.  Invalid input(s): FREET3 Anemia Panel: No results for input(s): VITAMINB12, FOLATE, FERRITIN, TIBC, IRON, RETICCTPCT in the last 72 hours.   PHYSICAL EXAM General: Well  developed, well nourished, in no acute distress HEENT:  Normocephalic and atramatic Neck:  No JVD.  Lungs: Clear bilaterally to auscultation and percussion. Heart: HRRR . Normal S1 and S2 without gallops or murmurs.  Abdomen: Bowel sounds are positive, abdomen soft and non-tender  Msk:  Back normal, normal gait. Normal strength and tone for age. Extremities: No clubbing, cyanosis or edema.   Neuro: Alert and oriented X 3. Psych:  Good affect, responds appropriately  TELEMETRY:  EKG 1000: Atrial flutter 116bpm Anterior infarct , new Inferior injury pattern** ** ACUTE MI / STEMI ** **  Repeat EKG 1116: Atrial fibrillation with rapid ventricular response 112bpm, ST elevation no longer present inferior leads    ASSESSMENT AND PLAN:  Remains in atrial flutter after 36 hours of amiodarone drip.   Echo:  1. LVEF 45-50%. Mild LVH.  The mitral valve is myxomatous. Mild Mitral valve regurgitation.The aortic valve is tricuspid Mild  thickening of the aortic valve Mild calcification of the aortic valve. No stenosis of the aortic valve.  EKG drawn earlier shows possible STEMI in inferior leads, but repeat EKG ST elevation was resolved. Could be lead placement error or coronary spasm. Pt was asymptomatic. Will keep patient additional day and trend 3 troponins.     Active Problems:   Atrial flutter ([place])    Jake Bathe, NP-C 06/03/2018 8:47 AM Cell: [phone]

## 2018-06-03 NOTE — Progress Notes (Signed)
David Anderson at [place] NAME: David Anderson    MR#:  [id number]  DATE OF BIRTH:  1955/05/11  SUBJECTIVE:  Heart rate continues to be poorly controlled patient just wants to go home REVIEW OF SYSTEMS:   Review of Systems  Constitutional: Negative for chills, fever and weight loss.  HENT: Negative for ear discharge, ear pain and nosebleeds.   Eyes: Negative for blurred vision, pain and discharge.  Respiratory: Negative for sputum production, shortness of breath, wheezing and stridor.   Cardiovascular: Positive for palpitations. Negative for chest pain, orthopnea and PND.  Gastrointestinal: Negative for abdominal pain, diarrhea, nausea and vomiting.  Genitourinary: Negative for frequency and urgency.  Musculoskeletal: Negative for back pain and joint pain.  Neurological: Positive for weakness. Negative for sensory change, speech change and focal weakness.  Psychiatric/Behavioral: Negative for depression and hallucinations. The patient is not nervous/anxious.    Tolerating Diet:yes Tolerating PT: not needed  DRUG ALLERGIES:  No Known Allergies  VITALS:  Blood pressure 126/80, pulse (!) 122, temperature (!) 97.5 F (36.4 C), temperature source Oral, resp. rate 19, height 5\' 9"  (1.753 m), weight 82.6 kg, SpO2 95 %.  PHYSICAL EXAMINATION:   Physical Exam  GENERAL:  63 y.o.-year-old patient lying in the bed with no acute distress.  EYES: Pupils equal, round, reactive to light and accommodation. No scleral icterus. Extraocular muscles intact.  HEENT: Head atraumatic, normocephalic. Oropharynx and nasopharynx clear.  NECK:  Supple, no jugular venous distention. No thyroid enlargement, no tenderness.  LUNGS: Normal breath sounds bilaterally, no wheezing, rales, rhonchi. No use of accessory muscles of respiration.  CARDIOVASCULAR: S1, S2 normal. No murmurs, rubs, or gallops. Tachycardia ABDOMEN: Soft, nontender, nondistended. Bowel sounds  present. No organomegaly or mass.  EXTREMITIES: No cyanosis, clubbing or edema b/l.    NEUROLOGIC: Cranial nerves II through XII are intact. No focal Motor or sensory deficits b/l.   PSYCHIATRIC:  patient is alert and oriented x 3.  SKIN: No obvious rash, lesion, or ulcer.   LABORATORY PANEL:  CBC Recent Labs  Lab 06/03/18 1206  WBC 7.1  HGB 14.5  HCT 42.8  PLT 200    Chemistries  Recent Labs  Lab 05/31/18 1543  06/02/18 0458 06/03/18 0441  NA 140   < > 140 137  K 3.4*   < > 3.6 3.5  CL 104   < > 105 104  CO2 26   < > 25 24  GLUCOSE 110*   < > 126* 147*  BUN 18   < > 19 18  CREATININE 0.87   < > 0.73 0.69  CALCIUM 8.7*   < > 8.7* 8.6*  MG  --    < > 2.0  --   AST 30  --   --   --   ALT 35  --   --   --   ALKPHOS 43  --   --   --   BILITOT 0.8  --   --   --    < > = values in this interval not displayed.   Cardiac Enzymes Recent Labs  Lab 06/03/18 1206  TROPONINI <0.03   RADIOLOGY:  No results found. ASSESSMENT AND PLAN:   David Anderson  is a 63 y.o. male with a known history of diabetes mellitus, COPD, tobacco use presented to the hospital for routine colonoscopy after a clinic stool card was positive for occult blood.  Patient was found to be tachycardic.  Was taken to cardiology Dr. Etta Quill office.  He was given 2 doses of adenosine and metoprolol with no improvement.  Sent to the emergency room  * Atrial flutter/fibirllation with rapid ventricular rate--NEW -Echo reviewed shows no significant dysfunction -Heart rate under poor control increase metoprolol and Cardizem -Further recommendations per cardiology -Patient may need cardioversion  *COPD.  Has chronic mild wheezing which is stable.  Nebulizers ordered  *Stool positive for occult blood.  This was on routine stool card test in the office.  Will need colonoscopy once stabilized.  As outpatient.  * Tobacco use Counseled to quit > 3 minutes  *DVT prophylaxis with SCDs  CODE STATUS:  *full  DVT Prophylaxis: SCD  TOTAL TIME TAKING CARE OF THIS PATIENT: *30* minutes.  >50% time spent on counselling and coordination of care  POSSIBLE D/C IN *1 to 2* DAYS, DEPENDING ON CLINICAL CONDITION.  Note: This dictation was prepared with Dragon dictation along with smaller phrase technology. Any transcriptional errors that result from this process are unintentional.  Dustin Flock M.D on 06/03/2018 at 1:56 PM  Between 7am to 6pm - Pager - [phone]  After 6pm go to [email or web address] - password [email or web address] [facility]  Office  [phone]  CC: Primary care physician; Jodi Marble, MDPatient ID: David Anderson, male   DOB: December 17, 1955, 63 y.o.   MRN: [id number]

## 2018-06-04 LAB — CBC
HCT: 41.1 % (ref 39.0–52.0)
Hemoglobin: 13.8 g/dL (ref 13.0–17.0)
MCH: 29.9 pg (ref 26.0–34.0)
MCHC: 33.6 g/dL (ref 30.0–36.0)
MCV: 89 fL (ref 80.0–100.0)
NRBC: 0 % (ref 0.0–0.2)
Platelets: 191 10*3/uL (ref 150–400)
RBC: 4.62 MIL/uL (ref 4.22–5.81)
RDW: 12.4 % (ref 11.5–15.5)
WBC: 7 10*3/uL (ref 4.0–10.5)

## 2018-06-04 LAB — BASIC METABOLIC PANEL
Anion gap: 11 (ref 5–15)
BUN: 15 mg/dL (ref 8–23)
CO2: 24 mmol/L (ref 22–32)
Calcium: 8.3 mg/dL — ABNORMAL LOW (ref 8.9–10.3)
Chloride: 100 mmol/L (ref 98–111)
Creatinine, Ser: 0.87 mg/dL (ref 0.61–1.24)
GFR calc Af Amer: >60 mL/min (ref >60)
GFR calc non Af Amer: >60 mL/min (ref >60)
Glucose, Bld: 136 mg/dL — ABNORMAL HIGH (ref 70–99)
Potassium: 3.8 mmol/L (ref 3.5–5.1)
Sodium: 135 mmol/L (ref 135–145)

## 2018-06-04 LAB — GLUCOSE, CAPILLARY: Glucose-Capillary: 128 mg/dL — ABNORMAL HIGH (ref 70–99)

## 2018-06-04 LAB — HEPARIN LEVEL (UNFRACTIONATED): Heparin Unfractionated: 1.02 IU/mL — ABNORMAL HIGH (ref 0.30–0.70)

## 2018-06-04 LAB — APTT: aPTT: 82 seconds — ABNORMAL HIGH (ref 24–36)

## 2018-06-04 LAB — TROPONIN I: Troponin I: <0.03 ng/mL (ref <0.03)

## 2018-06-04 MED ORDER — AMIODARONE HCL 400 MG PO TABS: 400.0000 mg | ORAL_TABLET | Freq: Every day | ORAL | 0 refills | Status: AC

## 2018-06-04 MED ORDER — APIXABAN 5 MG PO TABS: 5.0000 mg | ORAL_TABLET | Freq: Two times a day (BID) | ORAL | 0 refills | Status: DC

## 2018-06-04 MED ORDER — METOPROLOL TARTRATE 100 MG PO TABS: 100.0000 mg | ORAL_TABLET | Freq: Two times a day (BID) | ORAL | 0 refills | Status: DC

## 2018-06-04 MED ORDER — DILTIAZEM HCL ER COATED BEADS 120 MG PO CP24: 120.0000 mg | ORAL_CAPSULE | Freq: Every day | ORAL | 0 refills | Status: DC

## 2018-06-04 NOTE — Progress Notes (Signed)
ANTICOAGULATION CONSULT NOTE - Initial Consult  Pharmacy Consult for Heparin Drip Indication: chest pain/ACS and atrial fibrillation  No Known Allergies  Patient Measurements: Height: 5\' 9"  (175.3 cm) Weight: 181 lb 8 oz (82.3 kg) IBW/kg (Calculated) : 70.7 Heparin Dosing Weight: 82.6 kg  Vital Signs: Temp: 99.5 F (37.5 C) (03/07 0409) Temp Source: Oral (03/07 0409) BP: 121/75 (03/07 0409) Pulse Rate: 126 (03/07 0409)  Labs: Recent Labs    06/01/18 0753 06/02/18 0458 06/03/18 0441 06/03/18 1206 06/03/18 1743 06/03/18 2327 06/04/18 0441  HGB 14.3  --   --  14.5  --   --  13.8  HCT 42.2  --   --  42.8  --   --  41.1  PLT 209  --   --  200  --   --  191  APTT  --   --   --  42*  --   --  82*  LABPROT  --   --   --  14.5  --   --   --   INR  --   --   --  1.1  --   --   --   HEPARINUNFRC  --   --   --  <0.10*  --   --  1.02*  CREATININE 0.63 0.73 0.69  --   --   --  0.87  TROPONINI  --   --   --  <0.03 <0.03 <0.03  --     Estimated Creatinine Clearance: 89.2 mL/min (by C-G formula based on SCr of 0.87 mg/dL).   Medical History: Past Medical History:  Diagnosis Date  . Arthritis    oa  . COPD (chronic obstructive pulmonary disease) ([place])   . History of blood transfusion 1973  . Hyperlipidemia   . Hypertension   . Pneumonia years ago   walking pneumonia  . Shortness of breath dyspnea    with exertion    Assessment: Patient is a 63yo male admitted with Atrial Flutter. Pharmacy consulted to initiate Heparin drip for ACS/STEMI. Per cardiology note they do not believe this to be STEMI but that patient is in coarse atrial fibrillation. Patient was started on Apixaban 5mg  bid this morning, last dose was given on 3/6 at 09:57. Apixaban has been discontinued.   Goal of Therapy:  Heparin level 0.3-0.7 units/ml aPTT 66-102 seconds Monitor platelets by anticoagulation protocol: Yes   Plan:  Have ordered baseline labs including Heparin level. Will begin Heparin drip  at 1000 units/hr at 22:00. Will check aPTT and Heparin level at 04:00 on 3/7. Will likely need to use aPTT levels to adjust Heparin until correlation with HL. Daily CBC while on Heparin drip.  3/7 AM aPTT 82, heparin level 1.02. Continue current regimen. Recheck aPTT, heparin level and CBC with tomorrow AM labs.  Sim Boast, PharmD, BCPS  06/04/18 5:55 AM

## 2018-06-04 NOTE — Discharge Summary (Signed)
[facility] [place] at [place], 63 y.o., DOB [birth year], MRN [id number]. Admission date: 05/31/2018 Discharge Date 06/04/2018 Primary MD David Marble, MD Admitting Physician Anderson Bow, MD  Admission Diagnosis  Atrial fibrillation with rapid ventricular response [facility]) [I48.91]  Discharge Diagnosis   Active Problems:   Atrial flutter (HCC)/atrial fibrillation with rapid ventricular rate    COPD without acute exacerbation Stool positive for occult blood hemoglobin stable with no evidence of GI blood loss Tobacco use Hypertension     Hospital Course  [name] a63 y.o.malewith a known history of diabetes mellitus, COPD, tobacco use presented to the hospital for routine colonoscopy after a clinic stool card was positive for occult blood. Patient was found to be tachycardic. Was taken to cardiology Dr. Etta Anderson office. He was given 2 doses of adenosine and metoprolol with no improvement. Sent to the emergency room.  Patient was noted to have atrial fibrillation /flutter.  She was seen by cardiology and started on amiodarone drip.  Heart rate continued to be poorly controlled therefore had to be started on metoprolol and Cardizem.  Heart rate is now improved.  He is recommended to start Eliquis per cardiology.  His hemoglobin is stable.  He will follow-up with cardiology for cardioversion in the near future.  Patient also was thought to have a possible acute MI based on EKG hours cardiac enzymes were negative and was seen by Dr. Jaclyn Anderson cardiology they did not feel the patient had acute ST MI.            Consults  cardiology  Significant Tests:  See full reports for all details     Dg Chest Portable 1 View  Result Date: 05/31/2018 CLINICAL DATA:  Tachycardia today. EXAM: PORTABLE CHEST 1 VIEW COMPARISON:  None. FINDINGS: Cardiac silhouette is normal in size. No mediastinal or hilar masses. No evidence of adenopathy. Lungs are clear.   No convincing pleural effusion.  No pneumothorax. Skeletal structures are grossly intact. IMPRESSION: No active disease. Electronically Signed   By: Anderson Anderson M.D.   On: 05/31/2018 16:09       Today   Subjective:   Anderson Anderson patient doing well denies any complaints Objective:   Blood pressure 102/70, pulse 66, temperature (!) 97.5 F (36.4 C), resp. rate 20, height 5\' 9"  (1.753 m), weight 82.3 kg, SpO2 95 %.  .  Intake/Output Summary (Last 24 hours) at 06/04/2018 1343 Last data filed at 06/04/2018 0949 Gross per 24 hour  Intake 204.78 ml  Output 1625 ml  Net -1420.22 ml    Exam VITAL SIGNS: Blood pressure 102/70, pulse 66, temperature (!) 97.5 F (36.4 C), resp. rate 20, height 5\' 9"  (1.753 m), weight 82.3 kg, SpO2 95 %.  GENERAL:  63 y.o.-year-old patient lying in the bed with no acute distress.  EYES: Pupils equal, round, reactive to light and accommodation. No scleral icterus. Extraocular muscles intact.  HEENT: Head atraumatic, normocephalic. Oropharynx and nasopharynx clear.  NECK:  Supple, no jugular venous distention. No thyroid enlargement, no tenderness.  LUNGS: Normal breath sounds bilaterally, no wheezing, rales,rhonchi or crepitation. No use of accessory muscles of respiration.  CARDIOVASCULAR: S1, S2 normal. No murmurs, rubs, or gallops.  ABDOMEN: Soft, nontender, nondistended. Bowel sounds present. No organomegaly or mass.  EXTREMITIES: No pedal edema, cyanosis, or clubbing.  NEUROLOGIC: Cranial nerves II through XII are intact. Muscle strength 5/5 in all extremities. Sensation intact. Gait not checked.  PSYCHIATRIC: The patient is alert and oriented  x 3.  SKIN: No obvious rash, lesion, or ulcer.   Data Review     CBC w Diff:  Lab Results  Component Value Date   WBC 7.0 06/04/2018   HGB 13.8 06/04/2018   HCT 41.1 06/04/2018   PLT 191 06/04/2018   LYMPHOPCT 26 05/31/2018   MONOPCT 11 05/31/2018   EOSPCT 3 05/31/2018   BASOPCT 0 05/31/2018   CMP:   Lab Results  Component Value Date   NA 135 06/04/2018   K 3.8 06/04/2018   CL 100 06/04/2018   CO2 24 06/04/2018   BUN 15 06/04/2018   CREATININE 0.87 06/04/2018   PROT 7.1 05/31/2018   ALBUMIN 4.3 05/31/2018   BILITOT 0.8 05/31/2018   ALKPHOS 43 05/31/2018   AST 30 05/31/2018   ALT 35 05/31/2018  .  Micro Results No results found for this or any previous visit (from the past 240 hour(s)).      Code Status Orders  (From admission, onward)         Start     Ordered   05/31/18 1716  Do not attempt resuscitation (DNR)  Continuous    Question Answer Comment  In the event of cardiac or respiratory ARREST Do not call a "code blue"   In the event of cardiac or respiratory ARREST Do not perform Intubation, CPR, defibrillation or ACLS   In the event of cardiac or respiratory ARREST Use medication by any route, position, wound care, and other measures to relive pain and suffering. May use oxygen, suction and manual treatment of airway obstruction as needed for comfort.      05/31/18 1715        Code Status History    Date Active Date Inactive Code Status Order ID Comments User Context   05/31/2018 4081 05/31/2018 1715 Full Code [id number]  Anderson Bow, MD ED   02/11/2015 1752 02/13/2015 1709 Full Code [id number]  Gaynelle Arabian, MD Inpatient          Follow-up Information    David Marble, MD Follow up in 1 week(s).   Specialty:  Internal Medicine Contact information: [place] [address] [phone]        Anderson David, MD Follow up.   Specialty:  Cardiology Why:  as per dr. Chalmers Anderson information: [place] [address] [phone]           Discharge Medications   Allergies as of 06/04/2018   No Known Allergies     Medication List    STOP taking these medications   chlorthalidone 25 MG tablet Commonly known as:  HYGROTON   methocarbamol 500 MG tablet Commonly known as:  ROBAXIN   oxyCODONE 5 MG  immediate release tablet Commonly known as:  Oxy IR/ROXICODONE   traMADol 50 MG tablet Commonly known as:  ULTRAM     TAKE these medications   amiodarone 400 MG tablet Commonly known as:  PACERONE Take 1 tablet (400 mg total) by mouth daily.   apixaban 5 MG Tabs tablet Commonly known as:  Eliquis Take 1 tablet (5 mg total) by mouth 2 (two) times daily.   ASPIRIN 81 PO Take 1 tablet by mouth daily.   cyclobenzaprine 10 MG tablet Commonly known as:  FLEXERIL Take 10 mg by mouth 3 (three) times daily as needed for muscle spasms.   diltiazem 120 MG 24 hr capsule Commonly known as:  Cardizem CD Take 1 capsule (120 mg total) by mouth daily.  fexofenadine 60 MG tablet Commonly known as:  ALLEGRA Take 60 mg by mouth 2 (two) times daily.   fluticasone 50 MCG/ACT nasal spray Commonly known as:  FLONASE Place 1 spray into both nostrils daily.   ibuprofen 600 MG tablet Commonly known as:  ADVIL,MOTRIN Take 1 tablet by mouth as needed for pain.   metFORMIN 1000 MG tablet Commonly known as:  GLUCOPHAGE Take 1,000 mg by mouth 2 (two) times daily with a meal.   metoprolol tartrate 100 MG tablet Commonly known as:  LOPRESSOR Take 1 tablet (100 mg total) by mouth 2 (two) times daily.   pravastatin 80 MG tablet Commonly known as:  PRAVACHOL Take 80 mg by mouth at bedtime.   tiotropium 18 MCG inhalation capsule Commonly known as:  SPIRIVA Place 18 mcg into inhaler and inhale daily.   traZODone 50 MG tablet Commonly known as:  DESYREL Take 50 mg by mouth at bedtime as needed.          Total Time in preparing paper work, data evaluation and todays exam - 63 minutes  Dustin Flock M.D on 06/04/2018 at [place]  [phone]

## 2018-06-04 NOTE — Progress Notes (Signed)
Pt discharged to home via wc.  Instructions  given to pt.  Questions answered.  No distress.  

## 2018-06-04 NOTE — Progress Notes (Signed)
SUBJECTIVE: Patient is feeling much better denies any chest pain or shortness of breath   Vitals:   06/03/18 1645 06/03/18 1927 06/04/18 0409 06/04/18 0806  BP:  114/71 121/75 102/70  Pulse: 96 (!) 52 (!) 126 66  Resp:    20  Temp:  98.3 F (36.8 C) 99.5 F (37.5 C) (!) 97.5 F (36.4 C)  TempSrc:  Oral Oral   SpO2:  98% 96% 95%  Weight:   82.3 kg   Height:        Intake/Output Summary (Last 24 hours) at 06/04/2018 0832 Last data filed at 06/04/2018 0721 Gross per 24 hour  Intake 441.78 ml  Output 1625 ml  Net -1183.22 ml    LABS: Basic Metabolic Panel: Recent Labs    06/02/18 0458 06/03/18 0441 06/04/18 0441  NA 140 137 135  K 3.6 3.5 3.8  CL 105 104 100  CO2 25 24 24   GLUCOSE 126* 147* 136*  BUN 19 18 15   CREATININE 0.73 0.69 0.87  CALCIUM 8.7* 8.6* 8.3*  MG 2.0  --   --    Liver Function Tests: No results for input(s): AST, ALT, ALKPHOS, BILITOT, PROT, ALBUMIN in the last 72 hours. No results for input(s): LIPASE, AMYLASE in the last 72 hours. CBC: Recent Labs    06/03/18 1206 06/04/18 0441  WBC 7.1 7.0  HGB 14.5 13.8  HCT 42.8 41.1  MCV 88.8 89.0  PLT 200 191   Cardiac Enzymes: Recent Labs    06/03/18 1206 06/03/18 1743 06/03/18 2327  TROPONINI <0.03 <0.03 <0.03   BNP: Invalid input(s): POCBNP D-Dimer: No results for input(s): DDIMER in the last 72 hours. Hemoglobin A1C: No results for input(s): HGBA1C in the last 72 hours. Fasting Lipid Panel: No results for input(s): CHOL, HDL, LDLCALC, TRIG, CHOLHDL, LDLDIRECT in the last 72 hours. Thyroid Function Tests: No results for input(s): TSH, T4TOTAL, T3FREE, THYROIDAB in the last 72 hours.  Invalid input(s): FREET3 Anemia Panel: No results for input(s): VITAMINB12, FOLATE, FERRITIN, TIBC, IRON, RETICCTPCT in the last 72 hours.   PHYSICAL EXAM General: Well developed, well nourished, in no acute distress HEENT:  Normocephalic and atramatic Neck:  No JVD.  Lungs: Clear bilaterally to  auscultation and percussion. Heart: HRRR . Normal S1 and S2 without gallops or murmurs.  Abdomen: Bowel sounds are positive, abdomen soft and non-tender  Msk:  Back normal, normal gait. Normal strength and tone for age. Extremities: No clubbing, cyanosis or edema.   Neuro: Alert and oriented X 3. Psych:  Good affect, responds appropriately  TELEMETRY: Sinus rhythm 90 bpm  ASSESSMENT AND PLAN: Patient ruled out for myocardial infarction thus ST elevation may have been an artifact due to atrial flutter.  Patient can go home on p.o. amiodarone 400 p.o. twice daily as well as metoprolol and Eliquis.  Heparin can be discontinued.  With follow-up in the office at 1:00 on Monday. Active Problems:   Atrial flutter ([place])    Anderson David, MD, [facility] 06/04/2018 8:32 AM

## 2018-06-06 DIAGNOSIS — I10 Essential (primary) hypertension: Secondary | ICD-10-CM | POA: Diagnosis not present

## 2018-06-06 DIAGNOSIS — R0602 Shortness of breath: Secondary | ICD-10-CM | POA: Diagnosis not present

## 2018-06-06 DIAGNOSIS — I509 Heart failure, unspecified: Secondary | ICD-10-CM | POA: Diagnosis not present

## 2018-06-06 DIAGNOSIS — I4892 Unspecified atrial flutter: Secondary | ICD-10-CM | POA: Diagnosis not present

## 2018-06-06 DIAGNOSIS — R072 Precordial pain: Secondary | ICD-10-CM | POA: Diagnosis not present

## 2018-06-06 DIAGNOSIS — R079 Chest pain, unspecified: Secondary | ICD-10-CM | POA: Diagnosis not present

## 2018-06-06 DIAGNOSIS — E785 Hyperlipidemia, unspecified: Secondary | ICD-10-CM | POA: Diagnosis not present

## 2018-06-10 DIAGNOSIS — E119 Type 2 diabetes mellitus without complications: Secondary | ICD-10-CM | POA: Diagnosis not present

## 2018-06-10 DIAGNOSIS — E785 Hyperlipidemia, unspecified: Secondary | ICD-10-CM | POA: Diagnosis not present

## 2018-06-13 DIAGNOSIS — F17200 Nicotine dependence, unspecified, uncomplicated: Secondary | ICD-10-CM | POA: Diagnosis not present

## 2018-06-13 DIAGNOSIS — E119 Type 2 diabetes mellitus without complications: Secondary | ICD-10-CM | POA: Diagnosis not present

## 2018-06-13 DIAGNOSIS — J301 Allergic rhinitis due to pollen: Secondary | ICD-10-CM | POA: Diagnosis not present

## 2018-06-13 DIAGNOSIS — L309 Dermatitis, unspecified: Secondary | ICD-10-CM | POA: Diagnosis not present

## 2018-06-13 DIAGNOSIS — J449 Chronic obstructive pulmonary disease, unspecified: Secondary | ICD-10-CM | POA: Diagnosis not present

## 2018-06-13 DIAGNOSIS — F17210 Nicotine dependence, cigarettes, uncomplicated: Secondary | ICD-10-CM | POA: Diagnosis not present

## 2018-06-13 DIAGNOSIS — E785 Hyperlipidemia, unspecified: Secondary | ICD-10-CM | POA: Diagnosis not present

## 2018-06-13 DIAGNOSIS — I4892 Unspecified atrial flutter: Secondary | ICD-10-CM | POA: Diagnosis not present

## 2018-06-13 DIAGNOSIS — E1165 Type 2 diabetes mellitus with hyperglycemia: Secondary | ICD-10-CM | POA: Diagnosis not present

## 2018-06-13 DIAGNOSIS — G4709 Other insomnia: Secondary | ICD-10-CM | POA: Diagnosis not present

## 2018-06-13 DIAGNOSIS — I10 Essential (primary) hypertension: Secondary | ICD-10-CM | POA: Diagnosis not present

## 2018-06-27 DIAGNOSIS — R0602 Shortness of breath: Secondary | ICD-10-CM | POA: Diagnosis not present

## 2018-06-27 DIAGNOSIS — I509 Heart failure, unspecified: Secondary | ICD-10-CM | POA: Diagnosis not present

## 2018-06-27 DIAGNOSIS — E785 Hyperlipidemia, unspecified: Secondary | ICD-10-CM | POA: Diagnosis not present

## 2018-06-27 DIAGNOSIS — R079 Chest pain, unspecified: Secondary | ICD-10-CM | POA: Diagnosis not present

## 2018-06-27 DIAGNOSIS — I10 Essential (primary) hypertension: Secondary | ICD-10-CM | POA: Diagnosis not present

## 2018-06-27 DIAGNOSIS — I4892 Unspecified atrial flutter: Secondary | ICD-10-CM | POA: Diagnosis not present

## 2018-08-08 DIAGNOSIS — I10 Essential (primary) hypertension: Secondary | ICD-10-CM | POA: Diagnosis not present

## 2018-08-08 DIAGNOSIS — E785 Hyperlipidemia, unspecified: Secondary | ICD-10-CM | POA: Diagnosis not present

## 2018-08-08 DIAGNOSIS — R079 Chest pain, unspecified: Secondary | ICD-10-CM | POA: Diagnosis not present

## 2018-08-08 DIAGNOSIS — F17210 Nicotine dependence, cigarettes, uncomplicated: Secondary | ICD-10-CM | POA: Diagnosis not present

## 2018-08-08 DIAGNOSIS — I4892 Unspecified atrial flutter: Secondary | ICD-10-CM | POA: Diagnosis not present

## 2018-08-08 DIAGNOSIS — I4891 Unspecified atrial fibrillation: Secondary | ICD-10-CM | POA: Diagnosis not present

## 2018-08-08 DIAGNOSIS — I509 Heart failure, unspecified: Secondary | ICD-10-CM | POA: Diagnosis not present

## 2018-08-08 DIAGNOSIS — R0602 Shortness of breath: Secondary | ICD-10-CM | POA: Diagnosis not present

## 2018-08-10 DIAGNOSIS — F17210 Nicotine dependence, cigarettes, uncomplicated: Secondary | ICD-10-CM | POA: Diagnosis not present

## 2018-08-29 DIAGNOSIS — I509 Heart failure, unspecified: Secondary | ICD-10-CM | POA: Diagnosis not present

## 2018-08-29 DIAGNOSIS — R000 Tachycardia, unspecified: Secondary | ICD-10-CM | POA: Diagnosis not present

## 2018-08-29 DIAGNOSIS — I4892 Unspecified atrial flutter: Secondary | ICD-10-CM | POA: Diagnosis not present

## 2018-08-29 DIAGNOSIS — I10 Essential (primary) hypertension: Secondary | ICD-10-CM | POA: Diagnosis not present

## 2018-08-29 DIAGNOSIS — R0602 Shortness of breath: Secondary | ICD-10-CM | POA: Diagnosis not present

## 2018-08-29 DIAGNOSIS — E785 Hyperlipidemia, unspecified: Secondary | ICD-10-CM | POA: Diagnosis not present

## 2018-09-14 DIAGNOSIS — I10 Essential (primary) hypertension: Secondary | ICD-10-CM | POA: Diagnosis not present

## 2018-09-14 DIAGNOSIS — E1165 Type 2 diabetes mellitus with hyperglycemia: Secondary | ICD-10-CM | POA: Diagnosis not present

## 2018-09-14 DIAGNOSIS — I509 Heart failure, unspecified: Secondary | ICD-10-CM | POA: Diagnosis not present

## 2018-09-14 DIAGNOSIS — E785 Hyperlipidemia, unspecified: Secondary | ICD-10-CM | POA: Diagnosis not present

## 2018-09-14 DIAGNOSIS — E119 Type 2 diabetes mellitus without complications: Secondary | ICD-10-CM | POA: Diagnosis not present

## 2018-09-16 DIAGNOSIS — F17200 Nicotine dependence, unspecified, uncomplicated: Secondary | ICD-10-CM | POA: Diagnosis not present

## 2018-09-16 DIAGNOSIS — G4709 Other insomnia: Secondary | ICD-10-CM | POA: Diagnosis not present

## 2018-09-16 DIAGNOSIS — J449 Chronic obstructive pulmonary disease, unspecified: Secondary | ICD-10-CM | POA: Diagnosis not present

## 2018-09-16 DIAGNOSIS — E785 Hyperlipidemia, unspecified: Secondary | ICD-10-CM | POA: Diagnosis not present

## 2018-09-16 DIAGNOSIS — I10 Essential (primary) hypertension: Secondary | ICD-10-CM | POA: Diagnosis not present

## 2018-09-16 DIAGNOSIS — E119 Type 2 diabetes mellitus without complications: Secondary | ICD-10-CM | POA: Diagnosis not present

## 2018-09-16 DIAGNOSIS — J301 Allergic rhinitis due to pollen: Secondary | ICD-10-CM | POA: Diagnosis not present

## 2018-09-16 DIAGNOSIS — L309 Dermatitis, unspecified: Secondary | ICD-10-CM | POA: Diagnosis not present

## 2018-11-29 DIAGNOSIS — R0602 Shortness of breath: Secondary | ICD-10-CM | POA: Diagnosis not present

## 2018-11-29 DIAGNOSIS — I10 Essential (primary) hypertension: Secondary | ICD-10-CM | POA: Diagnosis not present

## 2018-11-29 DIAGNOSIS — I509 Heart failure, unspecified: Secondary | ICD-10-CM | POA: Diagnosis not present

## 2018-11-29 DIAGNOSIS — E785 Hyperlipidemia, unspecified: Secondary | ICD-10-CM | POA: Diagnosis not present

## 2018-11-29 DIAGNOSIS — I4892 Unspecified atrial flutter: Secondary | ICD-10-CM | POA: Diagnosis not present

## 2018-12-21 DIAGNOSIS — R0602 Shortness of breath: Secondary | ICD-10-CM | POA: Diagnosis not present

## 2018-12-21 DIAGNOSIS — I10 Essential (primary) hypertension: Secondary | ICD-10-CM | POA: Diagnosis not present

## 2018-12-21 DIAGNOSIS — E119 Type 2 diabetes mellitus without complications: Secondary | ICD-10-CM | POA: Diagnosis not present

## 2018-12-21 DIAGNOSIS — I509 Heart failure, unspecified: Secondary | ICD-10-CM | POA: Diagnosis not present

## 2018-12-21 DIAGNOSIS — I4892 Unspecified atrial flutter: Secondary | ICD-10-CM | POA: Diagnosis not present

## 2018-12-23 DIAGNOSIS — F17200 Nicotine dependence, unspecified, uncomplicated: Secondary | ICD-10-CM | POA: Diagnosis not present

## 2018-12-23 DIAGNOSIS — F17210 Nicotine dependence, cigarettes, uncomplicated: Secondary | ICD-10-CM | POA: Diagnosis not present

## 2018-12-23 DIAGNOSIS — E785 Hyperlipidemia, unspecified: Secondary | ICD-10-CM | POA: Diagnosis not present

## 2018-12-23 DIAGNOSIS — N400 Enlarged prostate without lower urinary tract symptoms: Secondary | ICD-10-CM | POA: Diagnosis not present

## 2018-12-23 DIAGNOSIS — J301 Allergic rhinitis due to pollen: Secondary | ICD-10-CM | POA: Diagnosis not present

## 2018-12-23 DIAGNOSIS — E119 Type 2 diabetes mellitus without complications: Secondary | ICD-10-CM | POA: Diagnosis not present

## 2018-12-23 DIAGNOSIS — L309 Dermatitis, unspecified: Secondary | ICD-10-CM | POA: Diagnosis not present

## 2018-12-23 DIAGNOSIS — J449 Chronic obstructive pulmonary disease, unspecified: Secondary | ICD-10-CM | POA: Diagnosis not present

## 2018-12-23 DIAGNOSIS — G4709 Other insomnia: Secondary | ICD-10-CM | POA: Diagnosis not present

## 2018-12-23 DIAGNOSIS — I10 Essential (primary) hypertension: Secondary | ICD-10-CM | POA: Diagnosis not present

## 2018-12-29 DIAGNOSIS — E785 Hyperlipidemia, unspecified: Secondary | ICD-10-CM | POA: Diagnosis not present

## 2018-12-29 DIAGNOSIS — I4892 Unspecified atrial flutter: Secondary | ICD-10-CM | POA: Diagnosis not present

## 2018-12-29 DIAGNOSIS — I10 Essential (primary) hypertension: Secondary | ICD-10-CM | POA: Diagnosis not present

## 2018-12-29 DIAGNOSIS — I509 Heart failure, unspecified: Secondary | ICD-10-CM | POA: Diagnosis not present

## 2018-12-29 DIAGNOSIS — R0602 Shortness of breath: Secondary | ICD-10-CM | POA: Diagnosis not present

## 2019-04-03 DIAGNOSIS — I10 Essential (primary) hypertension: Secondary | ICD-10-CM | POA: Diagnosis not present

## 2019-04-03 DIAGNOSIS — E119 Type 2 diabetes mellitus without complications: Secondary | ICD-10-CM | POA: Diagnosis not present

## 2019-04-03 DIAGNOSIS — E785 Hyperlipidemia, unspecified: Secondary | ICD-10-CM | POA: Diagnosis not present

## 2019-04-03 DIAGNOSIS — N400 Enlarged prostate without lower urinary tract symptoms: Secondary | ICD-10-CM | POA: Diagnosis not present

## 2019-04-12 DIAGNOSIS — J301 Allergic rhinitis due to pollen: Secondary | ICD-10-CM | POA: Diagnosis not present

## 2019-04-12 DIAGNOSIS — J449 Chronic obstructive pulmonary disease, unspecified: Secondary | ICD-10-CM | POA: Diagnosis not present

## 2019-04-12 DIAGNOSIS — Z1331 Encounter for screening for depression: Secondary | ICD-10-CM | POA: Diagnosis not present

## 2019-04-12 DIAGNOSIS — G4709 Other insomnia: Secondary | ICD-10-CM | POA: Diagnosis not present

## 2019-04-12 DIAGNOSIS — I10 Essential (primary) hypertension: Secondary | ICD-10-CM | POA: Diagnosis not present

## 2019-04-12 DIAGNOSIS — E119 Type 2 diabetes mellitus without complications: Secondary | ICD-10-CM | POA: Diagnosis not present

## 2019-04-12 DIAGNOSIS — I8393 Asymptomatic varicose veins of bilateral lower extremities: Secondary | ICD-10-CM | POA: Diagnosis not present

## 2019-04-12 DIAGNOSIS — F17200 Nicotine dependence, unspecified, uncomplicated: Secondary | ICD-10-CM | POA: Diagnosis not present

## 2019-04-12 DIAGNOSIS — L309 Dermatitis, unspecified: Secondary | ICD-10-CM | POA: Diagnosis not present

## 2019-04-12 DIAGNOSIS — E785 Hyperlipidemia, unspecified: Secondary | ICD-10-CM | POA: Diagnosis not present

## 2019-05-01 DIAGNOSIS — I4892 Unspecified atrial flutter: Secondary | ICD-10-CM | POA: Diagnosis not present

## 2019-05-01 DIAGNOSIS — I509 Heart failure, unspecified: Secondary | ICD-10-CM | POA: Diagnosis not present

## 2019-05-01 DIAGNOSIS — R0602 Shortness of breath: Secondary | ICD-10-CM | POA: Diagnosis not present

## 2019-05-01 DIAGNOSIS — I10 Essential (primary) hypertension: Secondary | ICD-10-CM | POA: Diagnosis not present

## 2019-05-01 DIAGNOSIS — E785 Hyperlipidemia, unspecified: Secondary | ICD-10-CM | POA: Diagnosis not present

## 2019-05-29 DIAGNOSIS — I509 Heart failure, unspecified: Secondary | ICD-10-CM | POA: Diagnosis not present

## 2019-05-29 DIAGNOSIS — I10 Essential (primary) hypertension: Secondary | ICD-10-CM | POA: Diagnosis not present

## 2019-05-29 DIAGNOSIS — I4892 Unspecified atrial flutter: Secondary | ICD-10-CM | POA: Diagnosis not present

## 2019-05-29 DIAGNOSIS — R0602 Shortness of breath: Secondary | ICD-10-CM | POA: Diagnosis not present

## 2019-05-29 DIAGNOSIS — E785 Hyperlipidemia, unspecified: Secondary | ICD-10-CM | POA: Diagnosis not present

## 2019-06-05 DIAGNOSIS — I10 Essential (primary) hypertension: Secondary | ICD-10-CM | POA: Diagnosis not present

## 2019-06-05 DIAGNOSIS — I509 Heart failure, unspecified: Secondary | ICD-10-CM | POA: Diagnosis not present

## 2019-06-05 DIAGNOSIS — E785 Hyperlipidemia, unspecified: Secondary | ICD-10-CM | POA: Diagnosis not present

## 2019-06-05 DIAGNOSIS — R0602 Shortness of breath: Secondary | ICD-10-CM | POA: Diagnosis not present

## 2019-06-05 DIAGNOSIS — I4892 Unspecified atrial flutter: Secondary | ICD-10-CM | POA: Diagnosis not present

## 2019-06-05 DIAGNOSIS — I340 Nonrheumatic mitral (valve) insufficiency: Secondary | ICD-10-CM | POA: Diagnosis not present

## 2019-07-07 DIAGNOSIS — E119 Type 2 diabetes mellitus without complications: Secondary | ICD-10-CM | POA: Diagnosis not present

## 2019-07-11 DIAGNOSIS — F17200 Nicotine dependence, unspecified, uncomplicated: Secondary | ICD-10-CM | POA: Diagnosis not present

## 2019-07-11 DIAGNOSIS — E785 Hyperlipidemia, unspecified: Secondary | ICD-10-CM | POA: Diagnosis not present

## 2019-07-11 DIAGNOSIS — J449 Chronic obstructive pulmonary disease, unspecified: Secondary | ICD-10-CM | POA: Diagnosis not present

## 2019-07-11 DIAGNOSIS — G4709 Other insomnia: Secondary | ICD-10-CM | POA: Diagnosis not present

## 2019-07-11 DIAGNOSIS — I10 Essential (primary) hypertension: Secondary | ICD-10-CM | POA: Diagnosis not present

## 2019-07-11 DIAGNOSIS — S93401A Sprain of unspecified ligament of right ankle, initial encounter: Secondary | ICD-10-CM | POA: Diagnosis not present

## 2019-07-11 DIAGNOSIS — J301 Allergic rhinitis due to pollen: Secondary | ICD-10-CM | POA: Diagnosis not present

## 2019-07-11 DIAGNOSIS — E119 Type 2 diabetes mellitus without complications: Secondary | ICD-10-CM | POA: Diagnosis not present

## 2019-07-11 DIAGNOSIS — L309 Dermatitis, unspecified: Secondary | ICD-10-CM | POA: Diagnosis not present

## 2019-07-11 DIAGNOSIS — I8393 Asymptomatic varicose veins of bilateral lower extremities: Secondary | ICD-10-CM | POA: Diagnosis not present

## 2019-08-03 DIAGNOSIS — S93401A Sprain of unspecified ligament of right ankle, initial encounter: Secondary | ICD-10-CM | POA: Diagnosis not present

## 2019-08-03 DIAGNOSIS — Z1211 Encounter for screening for malignant neoplasm of colon: Secondary | ICD-10-CM | POA: Diagnosis not present

## 2019-08-03 DIAGNOSIS — I4892 Unspecified atrial flutter: Secondary | ICD-10-CM | POA: Diagnosis not present

## 2019-09-05 DIAGNOSIS — I10 Essential (primary) hypertension: Secondary | ICD-10-CM | POA: Diagnosis not present

## 2019-09-05 DIAGNOSIS — E785 Hyperlipidemia, unspecified: Secondary | ICD-10-CM | POA: Diagnosis not present

## 2019-09-05 DIAGNOSIS — I4892 Unspecified atrial flutter: Secondary | ICD-10-CM | POA: Diagnosis not present

## 2019-09-05 DIAGNOSIS — I509 Heart failure, unspecified: Secondary | ICD-10-CM | POA: Diagnosis not present

## 2019-09-05 DIAGNOSIS — R0602 Shortness of breath: Secondary | ICD-10-CM | POA: Diagnosis not present

## 2019-10-18 DIAGNOSIS — E119 Type 2 diabetes mellitus without complications: Secondary | ICD-10-CM | POA: Diagnosis not present

## 2019-10-25 DIAGNOSIS — H524 Presbyopia: Secondary | ICD-10-CM | POA: Diagnosis not present

## 2019-11-07 DIAGNOSIS — E119 Type 2 diabetes mellitus without complications: Secondary | ICD-10-CM | POA: Diagnosis not present

## 2019-11-07 DIAGNOSIS — I10 Essential (primary) hypertension: Secondary | ICD-10-CM | POA: Diagnosis not present

## 2019-11-07 DIAGNOSIS — E785 Hyperlipidemia, unspecified: Secondary | ICD-10-CM | POA: Diagnosis not present

## 2019-11-29 DIAGNOSIS — I10 Essential (primary) hypertension: Secondary | ICD-10-CM | POA: Diagnosis not present

## 2019-11-29 DIAGNOSIS — I8393 Asymptomatic varicose veins of bilateral lower extremities: Secondary | ICD-10-CM | POA: Diagnosis not present

## 2019-11-29 DIAGNOSIS — E119 Type 2 diabetes mellitus without complications: Secondary | ICD-10-CM | POA: Diagnosis not present

## 2019-11-29 DIAGNOSIS — E785 Hyperlipidemia, unspecified: Secondary | ICD-10-CM | POA: Diagnosis not present

## 2019-11-29 DIAGNOSIS — G4709 Other insomnia: Secondary | ICD-10-CM | POA: Diagnosis not present

## 2019-11-29 DIAGNOSIS — L309 Dermatitis, unspecified: Secondary | ICD-10-CM | POA: Diagnosis not present

## 2019-11-29 DIAGNOSIS — I4892 Unspecified atrial flutter: Secondary | ICD-10-CM | POA: Diagnosis not present

## 2019-11-29 DIAGNOSIS — F17200 Nicotine dependence, unspecified, uncomplicated: Secondary | ICD-10-CM | POA: Diagnosis not present

## 2019-11-29 DIAGNOSIS — S93401A Sprain of unspecified ligament of right ankle, initial encounter: Secondary | ICD-10-CM | POA: Diagnosis not present

## 2019-11-29 DIAGNOSIS — J449 Chronic obstructive pulmonary disease, unspecified: Secondary | ICD-10-CM | POA: Diagnosis not present

## 2019-11-29 DIAGNOSIS — J301 Allergic rhinitis due to pollen: Secondary | ICD-10-CM | POA: Diagnosis not present

## 2019-12-06 DIAGNOSIS — I4892 Unspecified atrial flutter: Secondary | ICD-10-CM | POA: Diagnosis not present

## 2019-12-06 DIAGNOSIS — I10 Essential (primary) hypertension: Secondary | ICD-10-CM | POA: Diagnosis not present

## 2019-12-06 DIAGNOSIS — E119 Type 2 diabetes mellitus without complications: Secondary | ICD-10-CM | POA: Diagnosis not present

## 2019-12-06 DIAGNOSIS — J449 Chronic obstructive pulmonary disease, unspecified: Secondary | ICD-10-CM | POA: Diagnosis not present

## 2019-12-06 DIAGNOSIS — E785 Hyperlipidemia, unspecified: Secondary | ICD-10-CM | POA: Diagnosis not present

## 2020-03-05 DIAGNOSIS — E119 Type 2 diabetes mellitus without complications: Secondary | ICD-10-CM | POA: Diagnosis not present

## 2020-03-06 DIAGNOSIS — J209 Acute bronchitis, unspecified: Secondary | ICD-10-CM | POA: Diagnosis not present

## 2020-03-06 DIAGNOSIS — I2510 Atherosclerotic heart disease of native coronary artery without angina pectoris: Secondary | ICD-10-CM | POA: Diagnosis not present

## 2020-03-06 DIAGNOSIS — R7303 Prediabetes: Secondary | ICD-10-CM | POA: Diagnosis not present

## 2020-03-06 DIAGNOSIS — E119 Type 2 diabetes mellitus without complications: Secondary | ICD-10-CM | POA: Diagnosis not present

## 2020-03-06 DIAGNOSIS — J449 Chronic obstructive pulmonary disease, unspecified: Secondary | ICD-10-CM | POA: Diagnosis not present

## 2020-03-06 DIAGNOSIS — S93401A Sprain of unspecified ligament of right ankle, initial encounter: Secondary | ICD-10-CM | POA: Diagnosis not present

## 2020-03-06 DIAGNOSIS — I4892 Unspecified atrial flutter: Secondary | ICD-10-CM | POA: Diagnosis not present

## 2020-03-06 DIAGNOSIS — E785 Hyperlipidemia, unspecified: Secondary | ICD-10-CM | POA: Diagnosis not present

## 2020-03-06 DIAGNOSIS — F17200 Nicotine dependence, unspecified, uncomplicated: Secondary | ICD-10-CM | POA: Diagnosis not present

## 2020-03-06 DIAGNOSIS — I10 Essential (primary) hypertension: Secondary | ICD-10-CM | POA: Diagnosis not present

## 2020-03-06 DIAGNOSIS — G4709 Other insomnia: Secondary | ICD-10-CM | POA: Diagnosis not present

## 2020-08-30 DIAGNOSIS — E119 Type 2 diabetes mellitus without complications: Secondary | ICD-10-CM | POA: Diagnosis not present

## 2020-08-30 DIAGNOSIS — I10 Essential (primary) hypertension: Secondary | ICD-10-CM | POA: Diagnosis not present

## 2020-08-30 DIAGNOSIS — J449 Chronic obstructive pulmonary disease, unspecified: Secondary | ICD-10-CM | POA: Diagnosis not present

## 2020-08-30 DIAGNOSIS — J309 Allergic rhinitis, unspecified: Secondary | ICD-10-CM | POA: Diagnosis not present

## 2020-08-30 DIAGNOSIS — E785 Hyperlipidemia, unspecified: Secondary | ICD-10-CM | POA: Diagnosis not present

## 2020-08-30 DIAGNOSIS — G8929 Other chronic pain: Secondary | ICD-10-CM | POA: Diagnosis not present

## 2020-08-30 DIAGNOSIS — D6869 Other thrombophilia: Secondary | ICD-10-CM | POA: Diagnosis not present

## 2020-08-30 DIAGNOSIS — I4891 Unspecified atrial fibrillation: Secondary | ICD-10-CM | POA: Diagnosis not present

## 2020-08-30 DIAGNOSIS — G4700 Insomnia, unspecified: Secondary | ICD-10-CM | POA: Diagnosis not present

## 2020-10-30 DIAGNOSIS — E119 Type 2 diabetes mellitus without complications: Secondary | ICD-10-CM | POA: Diagnosis not present

## 2020-11-01 DIAGNOSIS — I509 Heart failure, unspecified: Secondary | ICD-10-CM | POA: Diagnosis not present

## 2020-11-01 DIAGNOSIS — J449 Chronic obstructive pulmonary disease, unspecified: Secondary | ICD-10-CM | POA: Diagnosis not present

## 2020-11-01 DIAGNOSIS — I10 Essential (primary) hypertension: Secondary | ICD-10-CM | POA: Diagnosis not present

## 2020-11-01 DIAGNOSIS — F17200 Nicotine dependence, unspecified, uncomplicated: Secondary | ICD-10-CM | POA: Diagnosis not present

## 2020-11-01 DIAGNOSIS — S93401A Sprain of unspecified ligament of right ankle, initial encounter: Secondary | ICD-10-CM | POA: Diagnosis not present

## 2020-11-01 DIAGNOSIS — J209 Acute bronchitis, unspecified: Secondary | ICD-10-CM | POA: Diagnosis not present

## 2020-11-01 DIAGNOSIS — E785 Hyperlipidemia, unspecified: Secondary | ICD-10-CM | POA: Diagnosis not present

## 2020-11-01 DIAGNOSIS — G4709 Other insomnia: Secondary | ICD-10-CM | POA: Diagnosis not present

## 2020-11-01 DIAGNOSIS — E119 Type 2 diabetes mellitus without complications: Secondary | ICD-10-CM | POA: Diagnosis not present

## 2020-11-01 DIAGNOSIS — I4892 Unspecified atrial flutter: Secondary | ICD-10-CM | POA: Diagnosis not present

## 2021-02-13 DIAGNOSIS — D649 Anemia, unspecified: Secondary | ICD-10-CM | POA: Diagnosis not present

## 2021-02-13 DIAGNOSIS — I10 Essential (primary) hypertension: Secondary | ICD-10-CM | POA: Diagnosis not present

## 2021-02-13 DIAGNOSIS — E119 Type 2 diabetes mellitus without complications: Secondary | ICD-10-CM | POA: Diagnosis not present

## 2021-02-14 DIAGNOSIS — J449 Chronic obstructive pulmonary disease, unspecified: Secondary | ICD-10-CM | POA: Diagnosis not present

## 2021-02-14 DIAGNOSIS — E119 Type 2 diabetes mellitus without complications: Secondary | ICD-10-CM | POA: Diagnosis not present

## 2021-02-14 DIAGNOSIS — E785 Hyperlipidemia, unspecified: Secondary | ICD-10-CM | POA: Diagnosis not present

## 2021-02-14 DIAGNOSIS — Z0001 Encounter for general adult medical examination with abnormal findings: Secondary | ICD-10-CM | POA: Diagnosis not present

## 2021-02-14 DIAGNOSIS — F17200 Nicotine dependence, unspecified, uncomplicated: Secondary | ICD-10-CM | POA: Diagnosis not present

## 2021-02-14 DIAGNOSIS — S93401A Sprain of unspecified ligament of right ankle, initial encounter: Secondary | ICD-10-CM | POA: Diagnosis not present

## 2021-02-14 DIAGNOSIS — I10 Essential (primary) hypertension: Secondary | ICD-10-CM | POA: Diagnosis not present

## 2021-02-14 DIAGNOSIS — I509 Heart failure, unspecified: Secondary | ICD-10-CM | POA: Diagnosis not present

## 2021-02-14 DIAGNOSIS — I4892 Unspecified atrial flutter: Secondary | ICD-10-CM | POA: Diagnosis not present

## 2021-02-14 DIAGNOSIS — Z1331 Encounter for screening for depression: Secondary | ICD-10-CM | POA: Diagnosis not present

## 2021-02-14 DIAGNOSIS — R0602 Shortness of breath: Secondary | ICD-10-CM | POA: Diagnosis not present

## 2021-02-24 IMAGING — DX DG CHEST 1V PORT
1 series · 1 of 1 positions shown · non-contrast
Comparison: None.

CLINICAL DATA: Tachycardia today.

EXAM:
PORTABLE CHEST 1 VIEW

[chest ap]
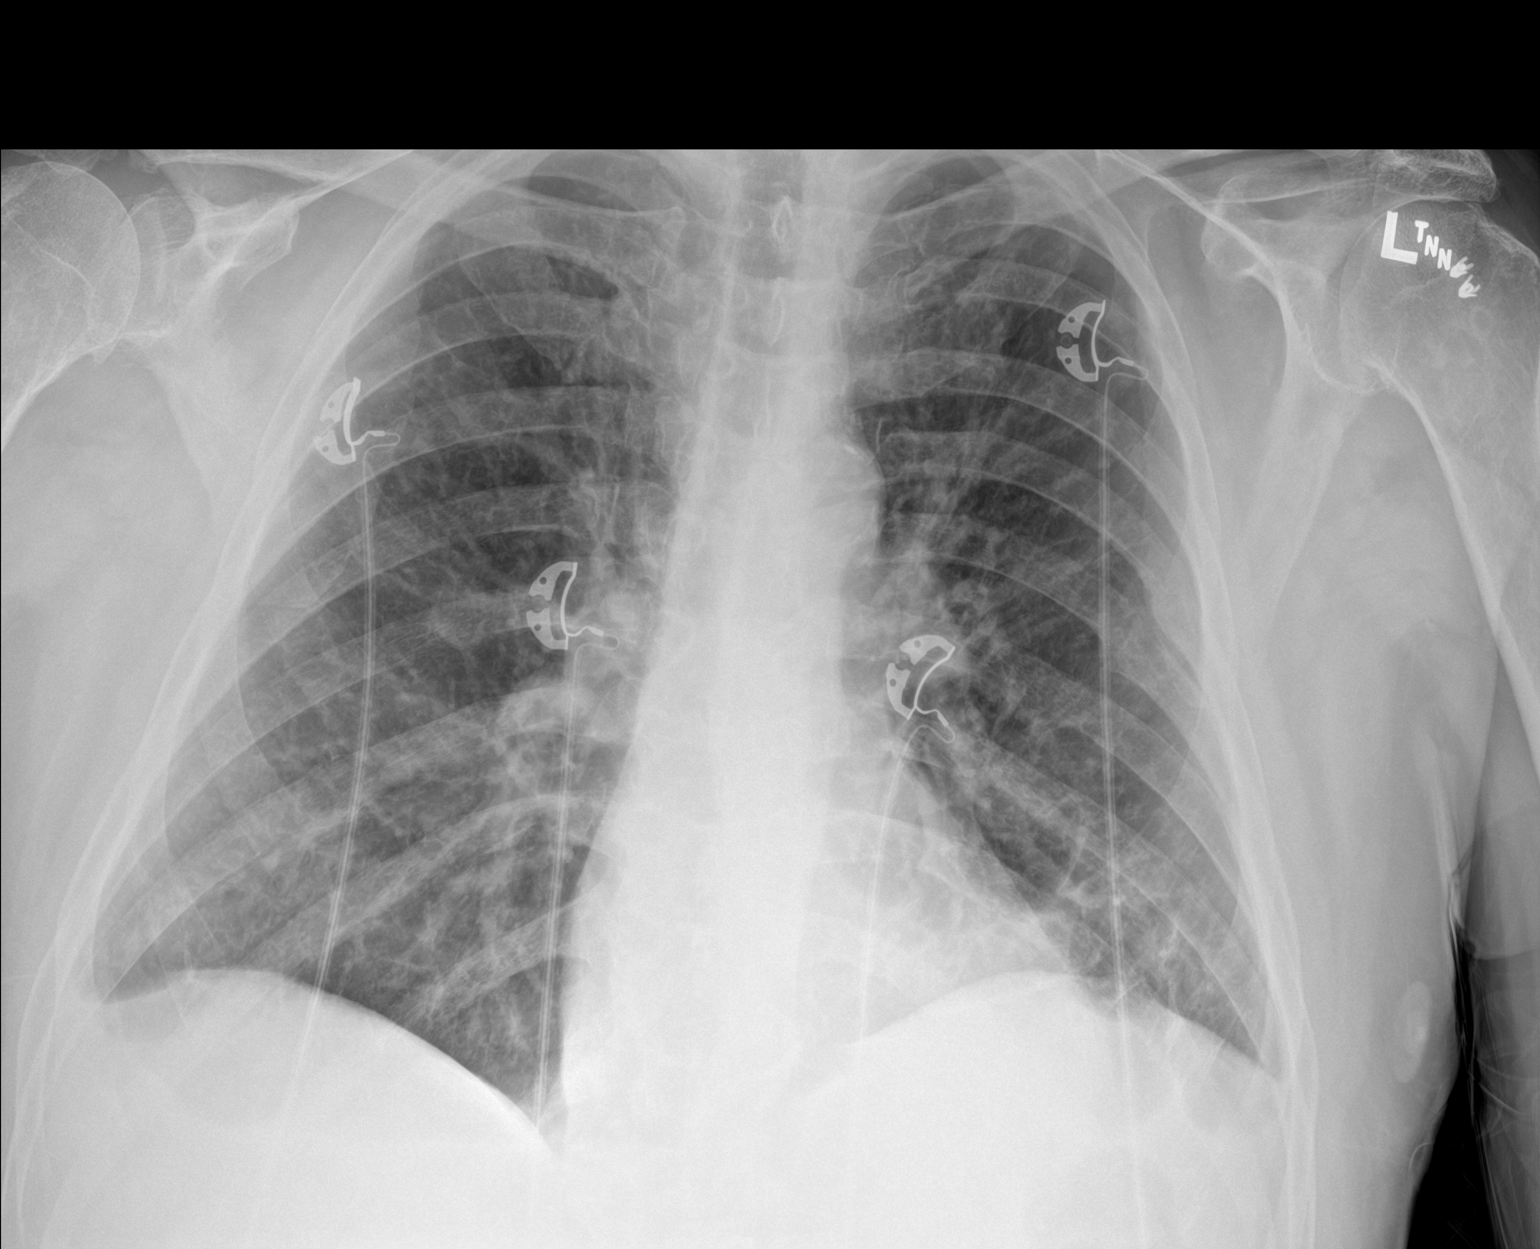

[1 of 1 positions shown; findings below may reference images not displayed]

FINDINGS: Cardiac silhouette is normal in size. No mediastinal or hilar
masses. No evidence of adenopathy.

Lungs are clear.  No convincing pleural effusion.  No pneumothorax.

Skeletal structures are grossly intact.
IMPRESSION: No active disease.

## 2021-05-22 DIAGNOSIS — D649 Anemia, unspecified: Secondary | ICD-10-CM | POA: Diagnosis not present

## 2021-05-22 DIAGNOSIS — E119 Type 2 diabetes mellitus without complications: Secondary | ICD-10-CM | POA: Diagnosis not present

## 2021-05-23 DIAGNOSIS — J449 Chronic obstructive pulmonary disease, unspecified: Secondary | ICD-10-CM | POA: Diagnosis not present

## 2021-05-23 DIAGNOSIS — E119 Type 2 diabetes mellitus without complications: Secondary | ICD-10-CM | POA: Diagnosis not present

## 2021-05-23 DIAGNOSIS — J301 Allergic rhinitis due to pollen: Secondary | ICD-10-CM | POA: Diagnosis not present

## 2021-05-23 DIAGNOSIS — E785 Hyperlipidemia, unspecified: Secondary | ICD-10-CM | POA: Diagnosis not present

## 2021-05-23 DIAGNOSIS — G4709 Other insomnia: Secondary | ICD-10-CM | POA: Diagnosis not present

## 2021-05-23 DIAGNOSIS — S93401A Sprain of unspecified ligament of right ankle, initial encounter: Secondary | ICD-10-CM | POA: Diagnosis not present

## 2021-05-23 DIAGNOSIS — I4892 Unspecified atrial flutter: Secondary | ICD-10-CM | POA: Diagnosis not present

## 2021-05-23 DIAGNOSIS — R69 Illness, unspecified: Secondary | ICD-10-CM | POA: Diagnosis not present

## 2021-05-23 DIAGNOSIS — F17210 Nicotine dependence, cigarettes, uncomplicated: Secondary | ICD-10-CM | POA: Diagnosis not present

## 2021-05-23 DIAGNOSIS — D649 Anemia, unspecified: Secondary | ICD-10-CM | POA: Diagnosis not present

## 2021-05-23 DIAGNOSIS — I10 Essential (primary) hypertension: Secondary | ICD-10-CM | POA: Diagnosis not present

## 2021-08-08 DIAGNOSIS — E119 Type 2 diabetes mellitus without complications: Secondary | ICD-10-CM | POA: Diagnosis not present

## 2021-08-08 DIAGNOSIS — H2513 Age-related nuclear cataract, bilateral: Secondary | ICD-10-CM | POA: Diagnosis not present

## 2021-08-21 DIAGNOSIS — E119 Type 2 diabetes mellitus without complications: Secondary | ICD-10-CM | POA: Diagnosis not present

## 2021-08-22 DIAGNOSIS — D649 Anemia, unspecified: Secondary | ICD-10-CM | POA: Diagnosis not present

## 2021-08-22 DIAGNOSIS — I4892 Unspecified atrial flutter: Secondary | ICD-10-CM | POA: Diagnosis not present

## 2021-08-22 DIAGNOSIS — I10 Essential (primary) hypertension: Secondary | ICD-10-CM | POA: Diagnosis not present

## 2021-08-22 DIAGNOSIS — E785 Hyperlipidemia, unspecified: Secondary | ICD-10-CM | POA: Diagnosis not present

## 2021-08-22 DIAGNOSIS — G4709 Other insomnia: Secondary | ICD-10-CM | POA: Diagnosis not present

## 2021-08-22 DIAGNOSIS — E119 Type 2 diabetes mellitus without complications: Secondary | ICD-10-CM | POA: Diagnosis not present

## 2021-08-22 DIAGNOSIS — R69 Illness, unspecified: Secondary | ICD-10-CM | POA: Diagnosis not present

## 2021-08-22 DIAGNOSIS — J449 Chronic obstructive pulmonary disease, unspecified: Secondary | ICD-10-CM | POA: Diagnosis not present

## 2021-08-22 DIAGNOSIS — S93401A Sprain of unspecified ligament of right ankle, initial encounter: Secondary | ICD-10-CM | POA: Diagnosis not present

## 2021-11-27 DIAGNOSIS — E119 Type 2 diabetes mellitus without complications: Secondary | ICD-10-CM | POA: Diagnosis not present

## 2021-11-28 DIAGNOSIS — G4709 Other insomnia: Secondary | ICD-10-CM | POA: Diagnosis not present

## 2021-11-28 DIAGNOSIS — D649 Anemia, unspecified: Secondary | ICD-10-CM | POA: Diagnosis not present

## 2021-11-28 DIAGNOSIS — E785 Hyperlipidemia, unspecified: Secondary | ICD-10-CM | POA: Diagnosis not present

## 2021-11-28 DIAGNOSIS — S93401A Sprain of unspecified ligament of right ankle, initial encounter: Secondary | ICD-10-CM | POA: Diagnosis not present

## 2021-11-28 DIAGNOSIS — R69 Illness, unspecified: Secondary | ICD-10-CM | POA: Diagnosis not present

## 2021-11-28 DIAGNOSIS — I10 Essential (primary) hypertension: Secondary | ICD-10-CM | POA: Diagnosis not present

## 2021-11-28 DIAGNOSIS — J449 Chronic obstructive pulmonary disease, unspecified: Secondary | ICD-10-CM | POA: Diagnosis not present

## 2021-11-28 DIAGNOSIS — I4892 Unspecified atrial flutter: Secondary | ICD-10-CM | POA: Diagnosis not present

## 2021-11-28 DIAGNOSIS — E119 Type 2 diabetes mellitus without complications: Secondary | ICD-10-CM | POA: Diagnosis not present

## 2021-11-28 DIAGNOSIS — Z0001 Encounter for general adult medical examination with abnormal findings: Secondary | ICD-10-CM | POA: Diagnosis not present

## 2021-11-28 DIAGNOSIS — F17210 Nicotine dependence, cigarettes, uncomplicated: Secondary | ICD-10-CM | POA: Diagnosis not present

## 2021-12-30 DIAGNOSIS — Z0100 Encounter for examination of eyes and vision without abnormal findings: Secondary | ICD-10-CM | POA: Diagnosis not present

## 2022-01-12 DIAGNOSIS — Z1211 Encounter for screening for malignant neoplasm of colon: Secondary | ICD-10-CM | POA: Diagnosis not present

## 2022-03-03 DIAGNOSIS — Z0001 Encounter for general adult medical examination with abnormal findings: Secondary | ICD-10-CM | POA: Diagnosis not present

## 2022-03-03 DIAGNOSIS — E119 Type 2 diabetes mellitus without complications: Secondary | ICD-10-CM | POA: Diagnosis not present

## 2022-03-03 DIAGNOSIS — E749 Disorder of carbohydrate metabolism, unspecified: Secondary | ICD-10-CM | POA: Diagnosis not present

## 2022-03-03 DIAGNOSIS — I10 Essential (primary) hypertension: Secondary | ICD-10-CM | POA: Diagnosis not present

## 2022-03-04 DIAGNOSIS — I10 Essential (primary) hypertension: Secondary | ICD-10-CM | POA: Diagnosis not present

## 2022-03-04 DIAGNOSIS — E119 Type 2 diabetes mellitus without complications: Secondary | ICD-10-CM | POA: Diagnosis not present

## 2022-03-04 DIAGNOSIS — D649 Anemia, unspecified: Secondary | ICD-10-CM | POA: Diagnosis not present

## 2022-03-04 DIAGNOSIS — I4892 Unspecified atrial flutter: Secondary | ICD-10-CM | POA: Diagnosis not present

## 2022-03-04 DIAGNOSIS — J449 Chronic obstructive pulmonary disease, unspecified: Secondary | ICD-10-CM | POA: Diagnosis not present

## 2022-03-04 DIAGNOSIS — Z1331 Encounter for screening for depression: Secondary | ICD-10-CM | POA: Diagnosis not present

## 2022-03-04 DIAGNOSIS — S93401A Sprain of unspecified ligament of right ankle, initial encounter: Secondary | ICD-10-CM | POA: Diagnosis not present

## 2022-03-04 DIAGNOSIS — Z0001 Encounter for general adult medical examination with abnormal findings: Secondary | ICD-10-CM | POA: Diagnosis not present

## 2022-03-04 DIAGNOSIS — R69 Illness, unspecified: Secondary | ICD-10-CM | POA: Diagnosis not present

## 2022-03-04 DIAGNOSIS — E785 Hyperlipidemia, unspecified: Secondary | ICD-10-CM | POA: Diagnosis not present

## 2022-03-04 DIAGNOSIS — G4709 Other insomnia: Secondary | ICD-10-CM | POA: Diagnosis not present

## 2022-05-11 ENCOUNTER — Other Ambulatory Visit: Payer: Self-pay | Admitting: Internal Medicine

## 2022-05-22 ENCOUNTER — Other Ambulatory Visit: Payer: Self-pay | Admitting: Internal Medicine

## 2022-06-04 ENCOUNTER — Other Ambulatory Visit: Payer: Medicare HMO

## 2022-06-04 DIAGNOSIS — R7301 Impaired fasting glucose: Secondary | ICD-10-CM

## 2022-06-04 DIAGNOSIS — E785 Hyperlipidemia, unspecified: Secondary | ICD-10-CM

## 2022-06-04 DIAGNOSIS — I10 Essential (primary) hypertension: Secondary | ICD-10-CM | POA: Diagnosis not present

## 2022-06-05 ENCOUNTER — Ambulatory Visit (INDEPENDENT_AMBULATORY_CARE_PROVIDER_SITE_OTHER): Payer: Medicare HMO | Admitting: Internal Medicine

## 2022-06-05 ENCOUNTER — Encounter: Payer: Self-pay | Admitting: Internal Medicine

## 2022-06-05 VITALS — HR 71 | Ht 69.0 in | Wt 167.6 lb

## 2022-06-05 DIAGNOSIS — M2550 Pain in unspecified joint: Secondary | ICD-10-CM | POA: Diagnosis not present

## 2022-06-05 DIAGNOSIS — E7800 Pure hypercholesterolemia, unspecified: Secondary | ICD-10-CM | POA: Diagnosis not present

## 2022-06-05 DIAGNOSIS — J301 Allergic rhinitis due to pollen: Secondary | ICD-10-CM | POA: Diagnosis not present

## 2022-06-05 DIAGNOSIS — E119 Type 2 diabetes mellitus without complications: Secondary | ICD-10-CM

## 2022-06-05 DIAGNOSIS — I10 Essential (primary) hypertension: Secondary | ICD-10-CM

## 2022-06-05 DIAGNOSIS — F5101 Primary insomnia: Secondary | ICD-10-CM

## 2022-06-05 DIAGNOSIS — I483 Typical atrial flutter: Secondary | ICD-10-CM | POA: Diagnosis not present

## 2022-06-05 DIAGNOSIS — R69 Illness, unspecified: Secondary | ICD-10-CM | POA: Diagnosis not present

## 2022-06-05 LAB — TSH: TSH: 0.867 u[IU]/mL (ref 0.450–4.500)

## 2022-06-05 LAB — CMP14+EGFR
ALT: 18 IU/L (ref 0–44)
AST: 22 IU/L (ref 0–40)
Albumin/Globulin Ratio: 1.4 (ref 1.2–2.2)
Albumin: 4.4 g/dL (ref 3.9–4.9)
Alkaline Phosphatase: 57 IU/L (ref 44–121)
BUN/Creatinine Ratio: 20 (ref 10–24)
BUN: 24 mg/dL (ref 8–27)
Bilirubin Total: 0.3 mg/dL (ref 0.0–1.2)
CO2: 25 mmol/L (ref 20–29)
Calcium: 9.6 mg/dL (ref 8.6–10.2)
Chloride: 100 mmol/L (ref 96–106)
Creatinine, Ser: 1.22 mg/dL (ref 0.76–1.27)
Globulin, Total: 3.1 g/dL (ref 1.5–4.5)
Glucose: 119 mg/dL — ABNORMAL HIGH (ref 70–99)
Potassium: 4 mmol/L (ref 3.5–5.2)
Sodium: 138 mmol/L (ref 134–144)
Total Protein: 7.5 g/dL (ref 6.0–8.5)
eGFR: 66 mL/min/{1.73_m2} (ref >59)

## 2022-06-05 LAB — LIPID PANEL
Chol/HDL Ratio: 2.1 ratio (ref 0.0–5.0)
Cholesterol, Total: 103 mg/dL (ref 100–199)
HDL: 49 mg/dL (ref >39)
LDL Chol Calc (NIH): 35 mg/dL (ref 0–99)
Triglycerides: 99 mg/dL (ref 0–149)
VLDL Cholesterol Cal: 19 mg/dL (ref 5–40)

## 2022-06-05 LAB — CBC WITH DIFFERENTIAL
Basophils Absolute: 0 10*3/uL (ref 0.0–0.2)
Basos: 1 %
EOS (ABSOLUTE): 0.3 10*3/uL (ref 0.0–0.4)
Eos: 6 %
Hematocrit: 41.5 % (ref 37.5–51.0)
Hemoglobin: 13.5 g/dL (ref 13.0–17.7)
Immature Grans (Abs): 0 10*3/uL (ref 0.0–0.1)
Immature Granulocytes: 0 %
Lymphocytes Absolute: 1.2 10*3/uL (ref 0.7–3.1)
Lymphs: 22 %
MCH: 30 pg (ref 26.6–33.0)
MCHC: 32.5 g/dL (ref 31.5–35.7)
MCV: 92 fL (ref 79–97)
Monocytes Absolute: 0.7 10*3/uL (ref 0.1–0.9)
Monocytes: 12 %
Neutrophils Absolute: 3.3 10*3/uL (ref 1.4–7.0)
Neutrophils: 59 %
RBC: 4.5 x10E6/uL (ref 4.14–5.80)
RDW: 13.1 % (ref 11.6–15.4)
WBC: 5.5 10*3/uL (ref 3.4–10.8)

## 2022-06-05 LAB — HEMOGLOBIN A1C
Est. average glucose Bld gHb Est-mCnc: 140 mg/dL
Hgb A1c MFr Bld: 6.5 % — ABNORMAL HIGH (ref 4.8–5.6)

## 2022-06-05 LAB — GLUCOSE, POCT (MANUAL RESULT ENTRY): POC Glucose: 89 mg/dl (ref 70–99)

## 2022-06-05 MED ORDER — CELECOXIB 200 MG PO CAPS: 200.0000 mg | ORAL_CAPSULE | Freq: Every morning | ORAL | 2 refills | Status: AC

## 2022-06-05 MED ORDER — CHLORTHALIDONE 25 MG PO TABS: 25.0000 mg | ORAL_TABLET | Freq: Every morning | ORAL | 0 refills | Status: DC

## 2022-06-05 MED ORDER — ROSUVASTATIN CALCIUM 40 MG PO TABS: 40.0000 mg | ORAL_TABLET | Freq: Every day | ORAL | 0 refills | Status: DC

## 2022-06-05 MED ORDER — APIXABAN 5 MG PO TABS: 5.0000 mg | ORAL_TABLET | Freq: Two times a day (BID) | ORAL | 0 refills | Status: DC

## 2022-06-05 MED ORDER — FLUTICASONE PROPIONATE 50 MCG/ACT NA SUSP: 1.0000 | Freq: Every day | NASAL | 2 refills | Status: DC

## 2022-06-05 MED ORDER — TRAZODONE HCL 50 MG PO TABS: 50.0000 mg | ORAL_TABLET | Freq: Every evening | ORAL | 2 refills | Status: DC | PRN

## 2022-06-05 NOTE — Progress Notes (Signed)
Established Patient Office Visit  Subjective:  [name], male    DOB: 1956/01/24  Age: 67 y.o. MRN: [id number]  Chief Complaint  Patient presents with   Follow-up    3 month fu    No new complaints, here for lab review and medication refills. Labs reviewed and notable for well controlled diabetes and lipids with unremarkable tsh and cmp. Denies any hypoglycemic episodes and home bg readings have been at target.     Past Medical History:  Diagnosis Date   Arthritis    oa   COPD (chronic obstructive pulmonary disease) ([place])    History of blood transfusion 1973   Hyperlipidemia    Hypertension    Pneumonia years ago   walking pneumonia   Shortness of breath dyspnea    with exertion    Past Surgical History:  Procedure Laterality Date   BACK SURGERY  1991   lower back   COLONOSCOPY WITH PROPOFOL N/A 05/31/2018   Procedure: COLONOSCOPY WITH PROPOFOL;  Surgeon: Toledo, Benay Pike, MD;  Location: [facility];  Service: Gastroenterology;  Laterality: N/A;   left shoulder rotator cuff repair  2001   surgery for staph infection  1973   both legs left worse than right, both legs open and drained   TOTAL HIP ARTHROPLASTY Left 02/11/2015   Procedure: TOTAL HIP ARTHROPLASTY ANTERIOR APPROACH;  Surgeon: Gaynelle Arabian, MD;  Location: [facility];  Service: Orthopedics;  Laterality: Left;    Social History   Socioeconomic History   Marital status: Divorced    Spouse name: Not on file   Number of children: Not on file   Years of education: Not on file   Highest education level: Not on file  Occupational History   Not on file  Tobacco Use   Smoking status: Every Day    Packs/day: 0.50    Years: 35.00    Total pack years: 17.50    Types: Cigarettes   Smokeless tobacco: Never  Substance and Sexual Activity   Alcohol use: Yes    Alcohol/week: 0.0 - 2.0 standard drinks of alcohol    Comment: social    Drug use: No   Sexual activity: Not on file  Other Topics  Concern   Not on file  Social History Narrative   Not on file   Social Determinants of Health   Financial Resource Strain: Not on file  Food Insecurity: Not on file  Transportation Needs: Not on file  Physical Activity: Not on file  Stress: Not on file  Social Connections: Not on file  Intimate Partner Violence: Not on file    No family history on file.  No Known Allergies  Review of Systems  All other systems reviewed and are negative.      Objective:   Pulse 71   Ht '5\' 9"'$  (1.753 m)   Wt 167 lb 9.6 oz (76 kg)   SpO2 96%   BMI 24.75 kg/m   Vitals:   06/05/22 1508  Pulse: 71  Height: '5\' 9"'$  (1.753 m)  Weight: 167 lb 9.6 oz (76 kg)  SpO2: 96%  BMI (Calculated): 24.74    Physical Exam Vitals reviewed.  Constitutional:      Appearance: Normal appearance.  HENT:     Head: Normocephalic.     Left Ear: There is no impacted cerumen.     Nose: Nose normal.     Mouth/Throat:     Mouth: Mucous membranes are moist.  Pharynx: No posterior oropharyngeal erythema.  Eyes:     Extraocular Movements: Extraocular movements intact.     Pupils: Pupils are equal, round, and reactive to light.  Cardiovascular:     Rate and Rhythm: Regular rhythm.     Chest Wall: PMI is not displaced.     Pulses: Normal pulses.     Heart sounds: Normal heart sounds. No murmur heard. Pulmonary:     Effort: Pulmonary effort is normal.     Breath sounds: Normal air entry. Examination of the right-lower field reveals rhonchi. Examination of the left-lower field reveals rhonchi. Wheezing and rhonchi present. No rales.     Comments: posteriorly Abdominal:     General: Abdomen is flat. Bowel sounds are normal. There is no distension.     Palpations: Abdomen is soft. There is no hepatomegaly, splenomegaly or mass.     Tenderness: There is no abdominal tenderness.  Musculoskeletal:        General: Normal range of motion.     Cervical back: Normal range of motion and neck supple.     Right  lower leg: No edema.     Left lower leg: No edema.  Skin:    General: Skin is warm and dry.  Neurological:     General: No focal deficit present.     Mental Status: He is alert and oriented to person, place, and time.     Cranial Nerves: No cranial nerve deficit.     Motor: No weakness.  Psychiatric:        Mood and Affect: Mood normal.        Behavior: Behavior normal.      Results for orders placed or performed in visit on 06/05/22  POCT Glucose (CBG)  Result Value Ref Range   POC Glucose 89 70 - 99 mg/dl    Recent Results (from the past 2160 hour(Aliyana Dlugosz))  Hemoglobin A1c     Status: Abnormal   Collection Time: 06/04/22  9:20 AM  Result Value Ref Range   Hgb A1c MFr Bld 6.5 (H) 4.8 - 5.6 %    Comment:          Prediabetes: 5.7 - 6.4          Diabetes: >6.4          Glycemic control for adults with diabetes: <7.0    Est. average glucose Bld gHb Est-mCnc 140 mg/dL  TSH     Status: None   Collection Time: 06/04/22  9:20 AM  Result Value Ref Range   TSH 0.867 0.450 - 4.500 uIU/mL  CMP14+EGFR     Status: Abnormal   Collection Time: 06/04/22  9:20 AM  Result Value Ref Range   Glucose 119 (H) 70 - 99 mg/dL   BUN 24 8 - 27 mg/dL   Creatinine, Ser 1.22 0.76 - 1.27 mg/dL   eGFR 66 >59 mL/min/1.73   BUN/Creatinine Ratio 20 10 - 24   Sodium 138 134 - 144 mmol/L   Potassium 4.0 3.5 - 5.2 mmol/L   Chloride 100 96 - 106 mmol/L   CO2 25 20 - 29 mmol/L   Calcium 9.6 8.6 - 10.2 mg/dL   Total Protein 7.5 6.0 - 8.5 g/dL   Albumin 4.4 3.9 - 4.9 g/dL   Globulin, Total 3.1 1.5 - 4.5 g/dL   Albumin/Globulin Ratio 1.4 1.2 - 2.2   Bilirubin Total 0.3 0.0 - 1.2 mg/dL   Alkaline Phosphatase 57 44 - 121 IU/L   AST 22 0 -  40 IU/L   ALT 18 0 - 44 IU/L  Lipid panel     Status: None   Collection Time: 06/04/22  9:20 AM  Result Value Ref Range   Cholesterol, Total 103 100 - 199 mg/dL   Triglycerides 99 0 - 149 mg/dL   HDL 49 >39 mg/dL   VLDL Cholesterol Cal 19 5 - 40 mg/dL   LDL Chol Calc  (NIH) 35 0 - 99 mg/dL   Chol/HDL Ratio 2.1 0.0 - 5.0 ratio    Comment:                                   T. Chol/HDL Ratio                                             Men  Women                               1/2 Avg.Risk  3.4    3.3                                   Avg.Risk  5.0    4.4                                2X Avg.Risk  9.6    7.1                                3X Avg.Risk 23.4   11.0   CBC With Differential     Status: None   Collection Time: 06/04/22  9:20 AM  Result Value Ref Range   WBC 5.5 3.4 - 10.8 x10E3/uL   RBC 4.50 4.14 - 5.80 x10E6/uL   Hemoglobin 13.5 13.0 - 17.7 g/dL   Hematocrit 41.5 37.5 - 51.0 %   MCV 92 79 - 97 fL   MCH 30.0 26.6 - 33.0 pg   MCHC 32.5 31.5 - 35.7 g/dL   RDW 13.1 11.6 - 15.4 %   Neutrophils 59 Not Estab. %   Lymphs 22 Not Estab. %   Monocytes 12 Not Estab. %   Eos 6 Not Estab. %   Basos 1 Not Estab. %   Neutrophils Absolute 3.3 1.4 - 7.0 x10E3/uL   Lymphocytes Absolute 1.2 0.7 - 3.1 x10E3/uL   Monocytes Absolute 0.7 0.1 - 0.9 x10E3/uL   EOS (ABSOLUTE) 0.3 0.0 - 0.4 x10E3/uL   Basophils Absolute 0.0 0.0 - 0.2 x10E3/uL   Immature Granulocytes 0 Not Estab. %   Immature Grans (Abs) 0.0 0.0 - 0.1 x10E3/uL  POCT Glucose (CBG)     Status: Normal   Collection Time: 06/05/22  3:20 PM  Result Value Ref Range   POC Glucose 89 70 - 99 mg/dl      Assessment & Plan:   Problem List Items Addressed This Visit   None Visit Diagnoses     Type 2 diabetes mellitus without complication, without long-term current use of insulin (HCC)    -  Primary   Relevant Medications   rosuvastatin (CRESTOR) 40  MG tablet   Other Relevant Orders   POCT Glucose (CBG) (Completed)     1. Type 2 diabetes mellitus without complication, without long-term current use of insulin (HCC) - POCT Glucose (CBG) - Hemoglobin A1c; Future  2. Typical atrial flutter (HCC) - apixaban (ELIQUIS) 5 MG TABS tablet; Take 1 tablet (5 mg total) by mouth 2 (two) times daily.   Dispense: 60 tablet; Refill: 0  3. Pure hypercholesterolemia - rosuvastatin (CRESTOR) 40 MG tablet; Take 1 tablet (40 mg total) by mouth daily.  Dispense: 90 tablet; Refill: 0 - Lipid panel; Future  4. Arthralgia, unspecified joint - celecoxib (CELEBREX) 200 MG capsule; Take 1 capsule (200 mg total) by mouth every morning. As needed for pain  Dispense: 30 capsule; Refill: 2  5. Primary hypertension - chlorthalidone (HYGROTON) 25 MG tablet; Take 1 tablet (25 mg total) by mouth every morning.  Dispense: 90 tablet; Refill: 0  6. Seasonal allergic rhinitis due to pollen - fluticasone (FLONASE) 50 MCG/ACT nasal spray; Place 1 spray into both nostrils daily.  Dispense: 16 mL; Refill: 2  7. Primary insomnia - traZODone (DESYREL) 50 MG tablet; Take 1 tablet (50 mg total) by mouth at bedtime as needed.  Dispense: 30 tablet; Refill: 2    No follow-ups on file.   Total time spent: 30 minutes  Volanda Napoleon, MD  06/05/2022

## 2022-06-12 ENCOUNTER — Other Ambulatory Visit: Payer: Self-pay

## 2022-06-12 MED ORDER — ONETOUCH VERIO VI STRP: ORAL_STRIP | 3 refills | Status: DC

## 2022-06-30 ENCOUNTER — Other Ambulatory Visit: Payer: Self-pay | Admitting: Internal Medicine

## 2022-07-01 ENCOUNTER — Other Ambulatory Visit: Payer: Self-pay | Admitting: Internal Medicine

## 2022-07-01 DIAGNOSIS — I483 Typical atrial flutter: Secondary | ICD-10-CM

## 2022-08-06 ENCOUNTER — Other Ambulatory Visit: Payer: Self-pay | Admitting: Nurse Practitioner

## 2022-08-06 DIAGNOSIS — I483 Typical atrial flutter: Secondary | ICD-10-CM

## 2022-08-14 ENCOUNTER — Other Ambulatory Visit: Payer: Self-pay | Admitting: Internal Medicine

## 2022-09-07 ENCOUNTER — Other Ambulatory Visit: Payer: Medicare HMO

## 2022-09-07 DIAGNOSIS — E119 Type 2 diabetes mellitus without complications: Secondary | ICD-10-CM | POA: Diagnosis not present

## 2022-09-07 DIAGNOSIS — E7800 Pure hypercholesterolemia, unspecified: Secondary | ICD-10-CM

## 2022-09-08 ENCOUNTER — Encounter: Payer: Self-pay | Admitting: Internal Medicine

## 2022-09-08 ENCOUNTER — Ambulatory Visit (INDEPENDENT_AMBULATORY_CARE_PROVIDER_SITE_OTHER): Payer: Medicare HMO | Admitting: Internal Medicine

## 2022-09-08 VITALS — BP 108/72 | HR 131 | Ht 69.0 in | Wt 158.0 lb

## 2022-09-08 DIAGNOSIS — E7800 Pure hypercholesterolemia, unspecified: Secondary | ICD-10-CM | POA: Diagnosis not present

## 2022-09-08 DIAGNOSIS — M1612 Unilateral primary osteoarthritis, left hip: Secondary | ICD-10-CM | POA: Diagnosis not present

## 2022-09-08 DIAGNOSIS — B351 Tinea unguium: Secondary | ICD-10-CM | POA: Diagnosis not present

## 2022-09-08 DIAGNOSIS — F5101 Primary insomnia: Secondary | ICD-10-CM

## 2022-09-08 DIAGNOSIS — J301 Allergic rhinitis due to pollen: Secondary | ICD-10-CM

## 2022-09-08 DIAGNOSIS — E119 Type 2 diabetes mellitus without complications: Secondary | ICD-10-CM | POA: Diagnosis not present

## 2022-09-08 DIAGNOSIS — I10 Essential (primary) hypertension: Secondary | ICD-10-CM | POA: Diagnosis not present

## 2022-09-08 LAB — LIPID PANEL
Chol/HDL Ratio: 2.3 ratio (ref 0.0–5.0)
Cholesterol, Total: 94 mg/dL — ABNORMAL LOW (ref 100–199)
HDL: 41 mg/dL (ref >39)
LDL Chol Calc (NIH): 34 mg/dL (ref 0–99)
Triglycerides: 100 mg/dL (ref 0–149)
VLDL Cholesterol Cal: 19 mg/dL (ref 5–40)

## 2022-09-08 LAB — POCT CBG (FASTING - GLUCOSE)-MANUAL ENTRY: Glucose Fasting, POC: 92 mg/dL (ref 70–99)

## 2022-09-08 LAB — HEMOGLOBIN A1C
Est. average glucose Bld gHb Est-mCnc: 143 mg/dL
Hgb A1c MFr Bld: 6.6 % — ABNORMAL HIGH (ref 4.8–5.6)

## 2022-09-08 LAB — POC CREATINE & ALBUMIN,URINE
Creatinine, POC: 10 mg/dL
Microalbumin Ur, POC: 30 mg/L

## 2022-09-08 MED ORDER — CELECOXIB 200 MG PO CAPS
200.0000 mg | ORAL_CAPSULE | Freq: Every day | ORAL | 0 refills | Status: DC
Start: 2022-09-08 — End: 2022-12-04

## 2022-09-08 MED ORDER — CHLORTHALIDONE 25 MG PO TABS: 25.0000 mg | ORAL_TABLET | Freq: Every morning | ORAL | 0 refills | Status: DC

## 2022-09-08 MED ORDER — TRAZODONE HCL 50 MG PO TABS: 50.0000 mg | ORAL_TABLET | Freq: Every evening | ORAL | 2 refills | Status: DC | PRN

## 2022-09-08 MED ORDER — TERBINAFINE HCL 250 MG PO TABS
250.0000 mg | ORAL_TABLET | Freq: Every day | ORAL | 0 refills | Status: DC
Start: 2022-09-08 — End: 2022-10-20

## 2022-09-08 MED ORDER — ROSUVASTATIN CALCIUM 40 MG PO TABS
40.0000 mg | ORAL_TABLET | Freq: Every day | ORAL | 0 refills | Status: DC
Start: 2022-09-08 — End: 2022-12-04

## 2022-09-08 NOTE — Progress Notes (Signed)
Established Patient Office Visit  Subjective:  [name], male    DOB: 1956/02/25  Age: 67 y.o. MRN: [id number]  Chief Complaint  Patient presents with   Follow-up    3 month follow up, discuss lab results.     Lost weight with diet and exercise. No new complaints, here for lab review and medication refills. Labs reviewed and notable for well controlled diabetes, A1c at target lipids also  at target. Denies any hypoglycemic episodes and home bg readings have been at target.    No other concerns at this time.   Past Medical History:  Diagnosis Date   Arthritis    oa   COPD (chronic obstructive pulmonary disease) (HCC)    History of blood transfusion 1973   Hyperlipidemia    Hypertension    Pneumonia years ago   walking pneumonia   Shortness of breath dyspnea    with exertion    Past Surgical History:  Procedure Laterality Date   BACK SURGERY  1991   lower back   COLONOSCOPY WITH PROPOFOL N/A 05/31/2018   Procedure: COLONOSCOPY WITH PROPOFOL;  Surgeon: Toledo, Boykin Nearing, MD;  Location: [facility];  Service: Gastroenterology;  Laterality: N/A;   left shoulder rotator cuff repair  2001   surgery for staph infection  1973   both legs left worse than right, both legs open and drained   TOTAL HIP ARTHROPLASTY Left 02/11/2015   Procedure: TOTAL HIP ARTHROPLASTY ANTERIOR APPROACH;  Surgeon: Ollen Gross, MD;  Location: [facility];  Service: Orthopedics;  Laterality: Left;    Social History   Socioeconomic History   Marital status: Divorced    Spouse name: Not on file   Number of children: Not on file   Years of education: Not on file   Highest education level: Not on file  Occupational History   Not on file  Tobacco Use   Smoking status: Every Day    Packs/day: 0.50    Years: 35.00    Additional pack years: 0.00    Total pack years: 17.50    Types: Cigarettes   Smokeless tobacco: Never  Substance and Sexual Activity   Alcohol use: Yes     Alcohol/week: 0.0 - 2.0 standard drinks of alcohol    Comment: social    Drug use: No   Sexual activity: Not on file  Other Topics Concern   Not on file  Social History Narrative   Not on file   Social Determinants of Health   Financial Resource Strain: Not on file  Food Insecurity: Not on file  Transportation Needs: Not on file  Physical Activity: Not on file  Stress: Not on file  Social Connections: Not on file  Intimate Partner Violence: Not on file    No family history on file.  No Known Allergies  Review of Systems  Constitutional:  Positive for weight loss.  HENT: Negative.    Eyes: Negative.   Respiratory: Negative.    Cardiovascular: Negative.   Gastrointestinal: Negative.   Genitourinary: Negative.   Musculoskeletal:  Positive for joint pain.  Skin: Negative.   Neurological: Negative.   Endo/Heme/Allergies: Negative.   Psychiatric/Behavioral:  The patient does not have insomnia.   All other systems reviewed and are negative.      Objective:   BP 108/72   Pulse (!) 131   Ht 5\' 9"  (1.753 m)   Wt 158 lb (71.7 kg)   SpO2 94%   BMI 23.33 kg/m  Vitals:   09/08/22 1524  BP: 108/72  Pulse: (!) 131  Height: 5\' 9"  (1.753 m)  Weight: 158 lb (71.7 kg)  SpO2: 94%  BMI (Calculated): 23.32    Physical Exam Vitals reviewed.  Constitutional:      Appearance: Normal appearance.  HENT:     Head: Normocephalic.     Left Ear: There is no impacted cerumen.     Nose: Nose normal.     Mouth/Throat:     Mouth: Mucous membranes are moist.     Pharynx: No posterior oropharyngeal erythema.  Eyes:     Extraocular Movements: Extraocular movements intact.     Pupils: Pupils are equal, round, and reactive to light.  Cardiovascular:     Rate and Rhythm: Regular rhythm.     Chest Wall: PMI is not displaced.     Pulses: Normal pulses.     Heart sounds: Normal heart sounds. No murmur heard. Pulmonary:     Effort: Pulmonary effort is normal.     Breath sounds:  Normal air entry. Examination of the right-lower field reveals rhonchi. Examination of the left-lower field reveals rhonchi. Wheezing and rhonchi present. No rales.     Comments: posteriorly Abdominal:     General: Abdomen is flat. Bowel sounds are normal. There is no distension.     Palpations: Abdomen is soft. There is no hepatomegaly, splenomegaly or mass.     Tenderness: There is no abdominal tenderness.  Musculoskeletal:        General: Normal range of motion.     Cervical back: Normal range of motion and neck supple.     Right lower leg: No edema.     Left lower leg: No edema.  Skin:    General: Skin is warm and dry.  Neurological:     General: No focal deficit present.     Mental Status: He is alert and oriented to person, place, and time.     Cranial Nerves: No cranial nerve deficit.     Motor: No weakness.  Psychiatric:        Mood and Affect: Mood normal.        Behavior: Behavior normal.      Results for orders placed or performed in visit on 09/08/22  POCT CBG (Fasting - Glucose)  Result Value Ref Range   Glucose Fasting, POC 92 70 - 99 mg/dL    Recent Results (from the past 2160 hour(Dencil Cayson))  Lipid panel     Status: Abnormal   Collection Time: 09/07/22  9:14 AM  Result Value Ref Range   Cholesterol, Total 94 (L) 100 - 199 mg/dL   Triglycerides 161 0 - 149 mg/dL   HDL 41 >09 mg/dL   VLDL Cholesterol Cal 19 5 - 40 mg/dL   LDL Chol Calc (NIH) 34 0 - 99 mg/dL   LDL CALC COMMENT: CANCELED     Comment: Test not performed  Result canceled by the ancillary.    Chol/HDL Ratio 2.3 0.0 - 5.0 ratio    Comment:                                   T. Chol/HDL Ratio  Men  Women                               1/2 Avg.Risk  3.4    3.3                                   Avg.Risk  5.0    4.4                                2X Avg.Risk  9.6    7.1                                3X Avg.Risk 23.4   11.0   Hemoglobin A1c     Status:  Abnormal   Collection Time: 09/07/22  9:14 AM  Result Value Ref Range   Hgb A1c MFr Bld 6.6 (H) 4.8 - 5.6 %    Comment:          Prediabetes: 5.7 - 6.4          Diabetes: >6.4          Glycemic control for adults with diabetes: <7.0    Est. average glucose Bld gHb Est-mCnc 143 mg/dL  POCT CBG (Fasting - Glucose)     Status: None   Collection Time: 09/08/22  3:31 PM  Result Value Ref Range   Glucose Fasting, POC 92 70 - 99 mg/dL      Assessment & Plan:   Problem List Items Addressed This Visit       Musculoskeletal and Integument   OA (osteoarthritis) of hip   Relevant Medications   celecoxib (CELEBREX) 200 MG capsule   Onychomycosis   Relevant Medications   terbinafine (LAMISIL) 250 MG tablet   Other Relevant Orders   Hepatic function panel   Other Visit Diagnoses     Primary hypertension    -  Primary   Relevant Medications   chlorthalidone (HYGROTON) 25 MG tablet   rosuvastatin (CRESTOR) 40 MG tablet   Type 2 diabetes mellitus without complication, without long-term current use of insulin (HCC)       Relevant Medications   rosuvastatin (CRESTOR) 40 MG tablet   Other Relevant Orders   POCT CBG (Fasting - Glucose) (Completed)   POC CREATINE & ALBUMIN,URINE   Seasonal allergic rhinitis due to pollen       Pure hypercholesterolemia       Relevant Medications   chlorthalidone (HYGROTON) 25 MG tablet   rosuvastatin (CRESTOR) 40 MG tablet   Primary insomnia       Relevant Medications   traZODone (DESYREL) 50 MG tablet       Return in about 6 weeks (around 10/20/2022) for fu with labs prior for terbinafine.   Total time spent: 20 minutes  Luna Fuse, MD  09/08/2022   This document may have been prepared by [facility] Voice Recognition software and as such may include unintentional dictation errors.

## 2022-09-11 ENCOUNTER — Ambulatory Visit: Payer: Medicare HMO | Admitting: Internal Medicine

## 2022-10-12 ENCOUNTER — Other Ambulatory Visit: Payer: Self-pay | Admitting: Internal Medicine

## 2022-10-12 DIAGNOSIS — I483 Typical atrial flutter: Secondary | ICD-10-CM

## 2022-10-12 DIAGNOSIS — B351 Tinea unguium: Secondary | ICD-10-CM

## 2022-10-16 ENCOUNTER — Other Ambulatory Visit: Payer: Self-pay

## 2022-10-16 ENCOUNTER — Other Ambulatory Visit: Payer: Medicare HMO

## 2022-10-16 DIAGNOSIS — B351 Tinea unguium: Secondary | ICD-10-CM | POA: Diagnosis not present

## 2022-10-17 LAB — HEPATIC FUNCTION PANEL
ALT: 12 IU/L (ref 0–44)
AST: 9 IU/L (ref 0–40)
Albumin: 4 g/dL (ref 3.9–4.9)
Alkaline Phosphatase: 57 IU/L (ref 44–121)
Bilirubin Total: 0.3 mg/dL (ref 0.0–1.2)
Bilirubin, Direct: 0.14 mg/dL (ref 0.00–0.40)
Total Protein: 7 g/dL (ref 6.0–8.5)

## 2022-10-20 ENCOUNTER — Ambulatory Visit: Payer: Medicare HMO | Admitting: Internal Medicine

## 2022-10-20 VITALS — BP 100/69 | HR 130 | Ht 69.0 in | Wt 159.6 lb

## 2022-10-20 DIAGNOSIS — B351 Tinea unguium: Secondary | ICD-10-CM

## 2022-10-20 DIAGNOSIS — E119 Type 2 diabetes mellitus without complications: Secondary | ICD-10-CM

## 2022-10-20 LAB — POCT CBG (FASTING - GLUCOSE)-MANUAL ENTRY: Glucose Fasting, POC: 146 mg/dL — AB (ref 70–99)

## 2022-10-20 MED ORDER — TERBINAFINE HCL 250 MG PO TABS: 250.0000 mg | ORAL_TABLET | Freq: Every day | ORAL | 0 refills | Status: DC

## 2022-10-20 NOTE — Progress Notes (Signed)
Established Patient Office Visit  Subjective:  [name], male    DOB: 21-Nov-1955  Age: 67 y.o. MRN: [id number]  Chief Complaint  Patient presents with   Follow-up    6 WEEK lab F/U     No new complaints, here for lab review and medication refills. Labs reviewed and notable for normal lfts. Endorses improvement in toenail discoloration since starting terbinafine.    No other concerns at this time.   Past Medical History:  Diagnosis Date   Arthritis    oa   COPD (chronic obstructive pulmonary disease) (HCC)    History of blood transfusion 1973   Hyperlipidemia    Hypertension    Pneumonia years ago   walking pneumonia   Shortness of breath dyspnea    with exertion    Past Surgical History:  Procedure Laterality Date   BACK SURGERY  1991   lower back   COLONOSCOPY WITH PROPOFOL N/A 05/31/2018   Procedure: COLONOSCOPY WITH PROPOFOL;  Surgeon: Toledo, Boykin Nearing, MD;  Location: [facility];  Service: Gastroenterology;  Laterality: N/A;   left shoulder rotator cuff repair  2001   surgery for staph infection  1973   both legs left worse than right, both legs open and drained   TOTAL HIP ARTHROPLASTY Left 02/11/2015   Procedure: TOTAL HIP ARTHROPLASTY ANTERIOR APPROACH;  Surgeon: Ollen Gross, MD;  Location: [facility];  Service: Orthopedics;  Laterality: Left;    Social History   Socioeconomic History   Marital status: Divorced    Spouse name: Not on file   Number of children: Not on file   Years of education: Not on file   Highest education level: Not on file  Occupational History   Not on file  Tobacco Use   Smoking status: Every Day    Current packs/day: 0.50    Average packs/day: 0.5 packs/day for 35.0 years (17.5 ttl pk-yrs)    Types: Cigarettes   Smokeless tobacco: Never  Substance and Sexual Activity   Alcohol use: Yes    Alcohol/week: 0.0 - 2.0 standard drinks of alcohol    Comment: social    Drug use: No   Sexual activity: Not on file   Other Topics Concern   Not on file  Social History Narrative   Not on file   Social Determinants of Health   Financial Resource Strain: Not on file  Food Insecurity: Not on file  Transportation Needs: Not on file  Physical Activity: Not on file  Stress: Not on file  Social Connections: Not on file  Intimate Partner Violence: Not on file    No family history on file.  No Known Allergies  Review of Systems  All other systems reviewed and are negative.      Objective:   BP 100/69   Pulse (!) 130   Ht 5\' 9"  (1.753 m)   Wt 159 lb 9.6 oz (72.4 kg)   SpO2 98%   BMI 23.57 kg/m   Vitals:   10/20/22 1447  BP: 100/69  Pulse: (!) 130  Height: 5\' 9"  (1.753 m)  Weight: 159 lb 9.6 oz (72.4 kg)  SpO2: 98%  BMI (Calculated): 23.56    Physical Exam Vitals reviewed.  Constitutional:      Appearance: Normal appearance.  HENT:     Head: Normocephalic.     Left Ear: There is no impacted cerumen.     Nose: Nose normal.     Mouth/Throat:     Mouth:  Mucous membranes are moist.     Pharynx: No posterior oropharyngeal erythema.  Eyes:     Extraocular Movements: Extraocular movements intact.     Pupils: Pupils are equal, round, and reactive to light.  Cardiovascular:     Rate and Rhythm: Regular rhythm.     Chest Wall: PMI is not displaced.     Pulses: Normal pulses.     Heart sounds: Normal heart sounds. No murmur heard. Pulmonary:     Effort: Pulmonary effort is normal.     Breath sounds: Normal air entry. Examination of the right-lower field reveals rhonchi. Examination of the left-lower field reveals rhonchi. Wheezing and rhonchi present. No rales.     Comments: posteriorly Abdominal:     General: Abdomen is flat. Bowel sounds are normal. There is no distension.     Palpations: Abdomen is soft. There is no hepatomegaly, splenomegaly or mass.     Tenderness: There is no abdominal tenderness.  Musculoskeletal:        General: Normal range of motion.     Cervical  back: Normal range of motion and neck supple.     Right lower leg: No edema.     Left lower leg: No edema.  Skin:    General: Skin is warm and dry.     Nails: There is no clubbing (base of toenails now pink).     Comments: Toenail bases are now pink  Neurological:     General: No focal deficit present.     Mental Status: He is alert and oriented to person, place, and time.     Cranial Nerves: No cranial nerve deficit.     Motor: No weakness.  Psychiatric:        Mood and Affect: Mood normal.        Behavior: Behavior normal.      Results for orders placed or performed in visit on 10/20/22  POCT CBG (Fasting - Glucose)  Result Value Ref Range   Glucose Fasting, POC 146 (A) 70 - 99 mg/dL    Recent Results (from the past 2160 hour(Marilou Barnfield))  Lipid panel     Status: Abnormal   Collection Time: 09/07/22  9:14 AM  Result Value Ref Range   Cholesterol, Total 94 (L) 100 - 199 mg/dL   Triglycerides 010 0 - 149 mg/dL   HDL 41 >27 mg/dL   VLDL Cholesterol Cal 19 5 - 40 mg/dL   LDL Chol Calc (NIH) 34 0 - 99 mg/dL   LDL CALC COMMENT: CANCELED     Comment: Test not performed  Result canceled by the ancillary.    Chol/HDL Ratio 2.3 0.0 - 5.0 ratio    Comment:                                   T. Chol/HDL Ratio                                             Men  Women                               1/2 Avg.Risk  3.4    3.3  Avg.Risk  5.0    4.4                                2X Avg.Risk  9.6    7.1                                3X Avg.Risk 23.4   11.0   Hemoglobin A1c     Status: Abnormal   Collection Time: 09/07/22  9:14 AM  Result Value Ref Range   Hgb A1c MFr Bld 6.6 (H) 4.8 - 5.6 %    Comment:          Prediabetes: 5.7 - 6.4          Diabetes: >6.4          Glycemic control for adults with diabetes: <7.0    Est. average glucose Bld gHb Est-mCnc 143 mg/dL  POCT CBG (Fasting - Glucose)     Status: None   Collection Time: 09/08/22  3:31 PM  Result  Value Ref Range   Glucose Fasting, POC 92 70 - 99 mg/dL  POC CREATINE & ALBUMIN,URINE     Status: None   Collection Time: 09/08/22  3:59 PM  Result Value Ref Range   Microalbumin Ur, POC 30 mg/L   Creatinine, POC 10 mg/dL   Albumin/Creatinine Ratio, Urine, POC 30-300   Hepatic function panel     Status: None   Collection Time: 10/16/22  9:37 AM  Result Value Ref Range   Total Protein 7.0 6.0 - 8.5 g/dL   Albumin 4.0 3.9 - 4.9 g/dL   Bilirubin Total 0.3 0.0 - 1.2 mg/dL   Bilirubin, Direct 0.98 0.00 - 0.40 mg/dL   Alkaline Phosphatase 57 44 - 121 IU/L   AST 9 0 - 40 IU/L   ALT 12 0 - 44 IU/L  POCT CBG (Fasting - Glucose)     Status: Abnormal   Collection Time: 10/20/22  2:54 PM  Result Value Ref Range   Glucose Fasting, POC 146 (A) 70 - 99 mg/dL      Assessment & Plan:   Problem List Items Addressed This Visit       Musculoskeletal and Integument   Onychomycosis - Primary   Relevant Medications   terbinafine (LAMISIL) 250 MG tablet   Other Visit Diagnoses     Type 2 diabetes mellitus without complication, without long-term current use of insulin (HCC)       Relevant Orders   POCT CBG (Fasting - Glucose) (Completed)       Return in about 6 weeks (around 12/01/2022) for fu with labs prior.   Total time spent: 20 minutes  Luna Fuse, MD  10/20/2022   This document may have been prepared by [facility] Voice Recognition software and as such may include unintentional dictation errors.

## 2022-11-13 ENCOUNTER — Other Ambulatory Visit: Payer: Self-pay | Admitting: Internal Medicine

## 2022-11-13 DIAGNOSIS — I483 Typical atrial flutter: Secondary | ICD-10-CM

## 2022-12-03 ENCOUNTER — Other Ambulatory Visit: Payer: Medicare HMO

## 2022-12-03 DIAGNOSIS — B351 Tinea unguium: Secondary | ICD-10-CM | POA: Diagnosis not present

## 2022-12-03 DIAGNOSIS — E119 Type 2 diabetes mellitus without complications: Secondary | ICD-10-CM | POA: Diagnosis not present

## 2022-12-04 ENCOUNTER — Encounter: Payer: Self-pay | Admitting: Internal Medicine

## 2022-12-04 ENCOUNTER — Ambulatory Visit (INDEPENDENT_AMBULATORY_CARE_PROVIDER_SITE_OTHER): Payer: Medicare HMO | Admitting: Internal Medicine

## 2022-12-04 VITALS — BP 110/70 | HR 93 | Ht 69.0 in | Wt 159.0 lb

## 2022-12-04 DIAGNOSIS — I10 Essential (primary) hypertension: Secondary | ICD-10-CM | POA: Diagnosis not present

## 2022-12-04 DIAGNOSIS — M1612 Unilateral primary osteoarthritis, left hip: Secondary | ICD-10-CM

## 2022-12-04 DIAGNOSIS — E119 Type 2 diabetes mellitus without complications: Secondary | ICD-10-CM

## 2022-12-04 DIAGNOSIS — E7800 Pure hypercholesterolemia, unspecified: Secondary | ICD-10-CM | POA: Diagnosis not present

## 2022-12-04 DIAGNOSIS — J301 Allergic rhinitis due to pollen: Secondary | ICD-10-CM | POA: Diagnosis not present

## 2022-12-04 LAB — COMPREHENSIVE METABOLIC PANEL WITH GFR
ALT: 16 IU/L (ref 0–44)
AST: 19 IU/L (ref 0–40)
Albumin: 4.3 g/dL (ref 3.9–4.9)
Alkaline Phosphatase: 66 IU/L (ref 44–121)
BUN/Creatinine Ratio: 19 (ref 10–24)
BUN: 23 mg/dL (ref 8–27)
Bilirubin Total: 0.4 mg/dL (ref 0.0–1.2)
CO2: 26 mmol/L (ref 20–29)
Calcium: 9.8 mg/dL (ref 8.6–10.2)
Chloride: 98 mmol/L (ref 96–106)
Creatinine, Ser: 1.23 mg/dL (ref 0.76–1.27)
Globulin, Total: 3.3 g/dL (ref 1.5–4.5)
Glucose: 114 mg/dL — ABNORMAL HIGH (ref 70–99)
Potassium: 4.1 mmol/L (ref 3.5–5.2)
Sodium: 139 mmol/L (ref 134–144)
Total Protein: 7.6 g/dL (ref 6.0–8.5)
eGFR: 65 mL/min/1.73

## 2022-12-04 LAB — POCT CBG (FASTING - GLUCOSE)-MANUAL ENTRY: Glucose Fasting, POC: 96 mg/dL (ref 70–99)

## 2022-12-04 LAB — LIPID PANEL
Chol/HDL Ratio: 2.4 ratio (ref 0.0–5.0)
Cholesterol, Total: 101 mg/dL (ref 100–199)
HDL: 42 mg/dL (ref >39)
LDL Chol Calc (NIH): 41 mg/dL (ref 0–99)
Triglycerides: 89 mg/dL (ref 0–149)
VLDL Cholesterol Cal: 18 mg/dL (ref 5–40)

## 2022-12-04 LAB — HEMOGLOBIN A1C
Est. average glucose Bld gHb Est-mCnc: 143 mg/dL
Hgb A1c MFr Bld: 6.6 % — ABNORMAL HIGH (ref 4.8–5.6)

## 2022-12-04 MED ORDER — CELECOXIB 200 MG PO CAPS
200.0000 mg | ORAL_CAPSULE | Freq: Every day | ORAL | 0 refills | Status: DC
Start: 2022-12-04 — End: 2023-03-16

## 2022-12-04 MED ORDER — FLUTICASONE PROPIONATE 50 MCG/ACT NA SUSP
1.0000 | Freq: Every day | NASAL | 2 refills | Status: DC
Start: 2022-12-04 — End: 2023-03-08

## 2022-12-04 MED ORDER — CHLORTHALIDONE 25 MG PO TABS
25.0000 mg | ORAL_TABLET | Freq: Every morning | ORAL | 0 refills | Status: DC
Start: 2022-12-04 — End: 2023-03-08

## 2022-12-04 MED ORDER — ROSUVASTATIN CALCIUM 40 MG PO TABS
40.0000 mg | ORAL_TABLET | Freq: Every day | ORAL | 0 refills | Status: DC
Start: 2022-12-04 — End: 2023-02-19

## 2022-12-04 NOTE — Progress Notes (Signed)
Established Patient Office Visit  Subjective:  [name], male    DOB: 01/19/1956  Age: 67 y.o. MRN: [id number]  Chief Complaint  Patient presents with   Follow-up    6 week with lab results    No new complaints, here for lab review and medication refills. Labs reviewed and notable for well controlled diabetes, A1c at target, lipids also at target with unremarkable cmp. Denies any hypoglycemic episodes and home bg readings have been at target.   No other concerns at this time.   Past Medical History:  Diagnosis Date   Arthritis    oa   COPD (chronic obstructive pulmonary disease) (HCC)    History of blood transfusion 1973   Hyperlipidemia    Hypertension    Pneumonia years ago   walking pneumonia   Shortness of breath dyspnea    with exertion    Past Surgical History:  Procedure Laterality Date   BACK SURGERY  1991   lower back   COLONOSCOPY WITH PROPOFOL N/A 05/31/2018   Procedure: COLONOSCOPY WITH PROPOFOL;  Surgeon: Toledo, Boykin Nearing, MD;  Location: [facility];  Service: Gastroenterology;  Laterality: N/A;   left shoulder rotator cuff repair  2001   surgery for staph infection  1973   both legs left worse than right, both legs open and drained   TOTAL HIP ARTHROPLASTY Left 02/11/2015   Procedure: TOTAL HIP ARTHROPLASTY ANTERIOR APPROACH;  Surgeon: Ollen Gross, MD;  Location: [facility];  Service: Orthopedics;  Laterality: Left;    Social History   Socioeconomic History   Marital status: Divorced    Spouse name: Not on file   Number of children: Not on file   Years of education: Not on file   Highest education level: Not on file  Occupational History   Not on file  Tobacco Use   Smoking status: Every Day    Current packs/day: 0.50    Average packs/day: 0.5 packs/day for 35.0 years (17.5 ttl pk-yrs)    Types: Cigarettes   Smokeless tobacco: Never  Substance and Sexual Activity   Alcohol use: Yes    Alcohol/week: 0.0 - 2.0 standard drinks  of alcohol    Comment: social    Drug use: No   Sexual activity: Not on file  Other Topics Concern   Not on file  Social History Narrative   Not on file   Social Determinants of Health   Financial Resource Strain: Not on file  Food Insecurity: Not on file  Transportation Needs: Not on file  Physical Activity: Not on file  Stress: Not on file  Social Connections: Not on file  Intimate Partner Violence: Not on file    No family history on file.  No Known Allergies  Review of Systems  Constitutional:  Negative for malaise/fatigue.  HENT: Negative.    Eyes: Negative.   Respiratory: Negative.    Cardiovascular: Negative.   Gastrointestinal: Negative.   Genitourinary: Negative.   Musculoskeletal:  Positive for joint pain.  Skin: Negative.   Neurological: Negative.   Endo/Heme/Allergies: Negative.   Psychiatric/Behavioral:  The patient does not have insomnia.   All other systems reviewed and are negative.      Objective:   BP 110/70   Pulse 93   Ht 5\' 9"  (1.753 m)   Wt 159 lb (72.1 kg)   SpO2 97%   BMI 23.48 kg/m   Vitals:   12/04/22 1434  BP: 110/70  Pulse: 93  Height: 5'  9" (1.753 m)  Weight: 159 lb (72.1 kg)  SpO2: 97%  BMI (Calculated): 23.47    Physical Exam Vitals reviewed.  Constitutional:      Appearance: Normal appearance.  HENT:     Head: Normocephalic.     Left Ear: There is no impacted cerumen.     Nose: Nose normal.     Mouth/Throat:     Mouth: Mucous membranes are moist.     Pharynx: No posterior oropharyngeal erythema.  Eyes:     Extraocular Movements: Extraocular movements intact.     Pupils: Pupils are equal, round, and reactive to light.  Cardiovascular:     Rate and Rhythm: Regular rhythm.     Chest Wall: PMI is not displaced.     Pulses: Normal pulses.     Heart sounds: Normal heart sounds. No murmur heard. Pulmonary:     Effort: Pulmonary effort is normal.     Breath sounds: Normal air entry. Examination of the  right-lower field reveals rhonchi. Examination of the left-lower field reveals rhonchi. Wheezing and rhonchi present. No rales.     Comments: posteriorly Abdominal:     General: Abdomen is flat. Bowel sounds are normal. There is no distension.     Palpations: Abdomen is soft. There is no hepatomegaly, splenomegaly or mass.     Tenderness: There is no abdominal tenderness.  Musculoskeletal:        General: Normal range of motion.     Cervical back: Normal range of motion and neck supple.     Right lower leg: No edema.     Left lower leg: No edema.  Skin:    General: Skin is warm and dry.  Neurological:     General: No focal deficit present.     Mental Status: He is alert and oriented to person, place, and time.     Cranial Nerves: No cranial nerve deficit.     Motor: No weakness.  Psychiatric:        Mood and Affect: Mood normal.        Behavior: Behavior normal.      Results for orders placed or performed in visit on 12/04/22  POCT CBG (Fasting - Glucose)  Result Value Ref Range   Glucose Fasting, POC 96 70 - 99 mg/dL    Recent Results (from the past 2160 hour(Jasmen Emrich))  Lipid panel     Status: Abnormal   Collection Time: 09/07/22  9:14 AM  Result Value Ref Range   Cholesterol, Total 94 (L) 100 - 199 mg/dL   Triglycerides 782 0 - 149 mg/dL   HDL 41 >95 mg/dL   VLDL Cholesterol Cal 19 5 - 40 mg/dL   LDL Chol Calc (NIH) 34 0 - 99 mg/dL   LDL CALC COMMENT: CANCELED     Comment: Test not performed  Result canceled by the ancillary.    Chol/HDL Ratio 2.3 0.0 - 5.0 ratio    Comment:                                   T. Chol/HDL Ratio                                             Men  Women  1/2 Avg.Risk  3.4    3.3                                   Avg.Risk  5.0    4.4                                2X Avg.Risk  9.6    7.1                                3X Avg.Risk 23.4   11.0   Hemoglobin A1c     Status: Abnormal   Collection Time: 09/07/22  9:14  AM  Result Value Ref Range   Hgb A1c MFr Bld 6.6 (H) 4.8 - 5.6 %    Comment:          Prediabetes: 5.7 - 6.4          Diabetes: >6.4          Glycemic control for adults with diabetes: <7.0    Est. average glucose Bld gHb Est-mCnc 143 mg/dL  POCT CBG (Fasting - Glucose)     Status: None   Collection Time: 09/08/22  3:31 PM  Result Value Ref Range   Glucose Fasting, POC 92 70 - 99 mg/dL  POC CREATINE & ALBUMIN,URINE     Status: None   Collection Time: 09/08/22  3:59 PM  Result Value Ref Range   Microalbumin Ur, POC 30 mg/L   Creatinine, POC 10 mg/dL   Albumin/Creatinine Ratio, Urine, POC 30-300   Hepatic function panel     Status: None   Collection Time: 10/16/22  9:37 AM  Result Value Ref Range   Total Protein 7.0 6.0 - 8.5 g/dL   Albumin 4.0 3.9 - 4.9 g/dL   Bilirubin Total 0.3 0.0 - 1.2 mg/dL   Bilirubin, Direct 1.61 0.00 - 0.40 mg/dL   Alkaline Phosphatase 57 44 - 121 IU/L   AST 9 0 - 40 IU/L   ALT 12 0 - 44 IU/L  POCT CBG (Fasting - Glucose)     Status: Abnormal   Collection Time: 10/20/22  2:54 PM  Result Value Ref Range   Glucose Fasting, POC 146 (A) 70 - 99 mg/dL  Lipid panel     Status: None   Collection Time: 12/03/22  8:44 AM  Result Value Ref Range   Cholesterol, Total 101 100 - 199 mg/dL   Triglycerides 89 0 - 149 mg/dL   HDL 42 >09 mg/dL   VLDL Cholesterol Cal 18 5 - 40 mg/dL   LDL Chol Calc (NIH) 41 0 - 99 mg/dL   Chol/HDL Ratio 2.4 0.0 - 5.0 ratio    Comment:                                   T. Chol/HDL Ratio                                             Men  Women  1/2 Avg.Risk  3.4    3.3                                   Avg.Risk  5.0    4.4                                2X Avg.Risk  9.6    7.1                                3X Avg.Risk 23.4   11.0   Hemoglobin A1c     Status: Abnormal   Collection Time: 12/03/22  8:44 AM  Result Value Ref Range   Hgb A1c MFr Bld 6.6 (H) 4.8 - 5.6 %    Comment:          Prediabetes:  5.7 - 6.4          Diabetes: >6.4          Glycemic control for adults with diabetes: <7.0    Est. average glucose Bld gHb Est-mCnc 143 mg/dL  Comprehensive metabolic panel     Status: Abnormal   Collection Time: 12/03/22  8:44 AM  Result Value Ref Range   Glucose 114 (H) 70 - 99 mg/dL   BUN 23 8 - 27 mg/dL   Creatinine, Ser 2.72 0.76 - 1.27 mg/dL   eGFR 65 >53 GU/YQI/3.47   BUN/Creatinine Ratio 19 10 - 24   Sodium 139 134 - 144 mmol/L   Potassium 4.1 3.5 - 5.2 mmol/L   Chloride 98 96 - 106 mmol/L   CO2 26 20 - 29 mmol/L   Calcium 9.8 8.6 - 10.2 mg/dL   Total Protein 7.6 6.0 - 8.5 g/dL   Albumin 4.3 3.9 - 4.9 g/dL   Globulin, Total 3.3 1.5 - 4.5 g/dL   Bilirubin Total 0.4 0.0 - 1.2 mg/dL   Alkaline Phosphatase 66 44 - 121 IU/L   AST 19 0 - 40 IU/L   ALT 16 0 - 44 IU/L  Hepatic function panel     Status: None   Collection Time: 12/03/22  2:08 PM  Result Value Ref Range   Total Protein 7.3 6.0 - 8.5 g/dL   Albumin 4.2 3.9 - 4.9 g/dL   Bilirubin Total 0.4 0.0 - 1.2 mg/dL   Bilirubin, Direct 4.25 0.00 - 0.40 mg/dL   Alkaline Phosphatase 65 44 - 121 IU/L   AST 19 0 - 40 IU/L   ALT 16 0 - 44 IU/L  POCT CBG (Fasting - Glucose)     Status: None   Collection Time: 12/04/22  2:39 PM  Result Value Ref Range   Glucose Fasting, POC 96 70 - 99 mg/dL      Assessment & Plan:  As per problem list  Problem List Items Addressed This Visit   None Visit Diagnoses     Type 2 diabetes mellitus without complication, without long-term current use of insulin (HCC)    -  Primary   Relevant Orders   POCT CBG (Fasting - Glucose) (Completed)       No follow-ups on file.   Total time spent: 20 minutes  Luna Fuse, MD  12/04/2022   This document may have been prepared by [facility] Voice Recognition software and as such may include unintentional  dictation errors.

## 2022-12-18 ENCOUNTER — Other Ambulatory Visit: Payer: Self-pay | Admitting: Internal Medicine

## 2022-12-18 DIAGNOSIS — F5101 Primary insomnia: Secondary | ICD-10-CM

## 2022-12-18 DIAGNOSIS — I483 Typical atrial flutter: Secondary | ICD-10-CM

## 2023-02-04 ENCOUNTER — Other Ambulatory Visit: Payer: Self-pay | Admitting: Internal Medicine

## 2023-02-18 ENCOUNTER — Other Ambulatory Visit: Payer: Self-pay | Admitting: Internal Medicine

## 2023-02-18 DIAGNOSIS — E7800 Pure hypercholesterolemia, unspecified: Secondary | ICD-10-CM

## 2023-03-01 DIAGNOSIS — H43813 Vitreous degeneration, bilateral: Secondary | ICD-10-CM | POA: Diagnosis not present

## 2023-03-01 DIAGNOSIS — Z0100 Encounter for examination of eyes and vision without abnormal findings: Secondary | ICD-10-CM | POA: Diagnosis not present

## 2023-03-01 DIAGNOSIS — H2513 Age-related nuclear cataract, bilateral: Secondary | ICD-10-CM | POA: Diagnosis not present

## 2023-03-01 DIAGNOSIS — E119 Type 2 diabetes mellitus without complications: Secondary | ICD-10-CM | POA: Diagnosis not present

## 2023-03-05 ENCOUNTER — Other Ambulatory Visit: Payer: Medicare HMO

## 2023-03-05 DIAGNOSIS — E119 Type 2 diabetes mellitus without complications: Secondary | ICD-10-CM | POA: Diagnosis not present

## 2023-03-05 DIAGNOSIS — E7800 Pure hypercholesterolemia, unspecified: Secondary | ICD-10-CM | POA: Diagnosis not present

## 2023-03-05 DIAGNOSIS — I10 Essential (primary) hypertension: Secondary | ICD-10-CM | POA: Diagnosis not present

## 2023-03-06 LAB — LIPID PANEL
Chol/HDL Ratio: 2.1 {ratio} (ref 0.0–5.0)
Cholesterol, Total: 107 mg/dL (ref 100–199)
HDL: 50 mg/dL (ref >39)
LDL Chol Calc (NIH): 43 mg/dL (ref 0–99)
Triglycerides: 64 mg/dL (ref 0–149)
VLDL Cholesterol Cal: 14 mg/dL (ref 5–40)

## 2023-03-06 LAB — HEMOGLOBIN A1C
Est. average glucose Bld gHb Est-mCnc: 134 mg/dL
Hgb A1c MFr Bld: 6.3 % — ABNORMAL HIGH (ref 4.8–5.6)

## 2023-03-06 LAB — HEPATITIS C ANTIBODY: Hep C Virus Ab: NONREACTIVE

## 2023-03-08 ENCOUNTER — Ambulatory Visit (INDEPENDENT_AMBULATORY_CARE_PROVIDER_SITE_OTHER): Payer: Medicare HMO | Admitting: Internal Medicine

## 2023-03-08 ENCOUNTER — Encounter: Payer: Self-pay | Admitting: Internal Medicine

## 2023-03-08 VITALS — BP 121/82 | HR 132 | Ht 69.0 in | Wt 153.4 lb

## 2023-03-08 DIAGNOSIS — F5101 Primary insomnia: Secondary | ICD-10-CM

## 2023-03-08 DIAGNOSIS — J301 Allergic rhinitis due to pollen: Secondary | ICD-10-CM | POA: Diagnosis not present

## 2023-03-08 DIAGNOSIS — E119 Type 2 diabetes mellitus without complications: Secondary | ICD-10-CM

## 2023-03-08 DIAGNOSIS — Z0001 Encounter for general adult medical examination with abnormal findings: Secondary | ICD-10-CM

## 2023-03-08 DIAGNOSIS — I483 Typical atrial flutter: Secondary | ICD-10-CM | POA: Diagnosis not present

## 2023-03-08 DIAGNOSIS — F17210 Nicotine dependence, cigarettes, uncomplicated: Secondary | ICD-10-CM | POA: Diagnosis not present

## 2023-03-08 DIAGNOSIS — Z1331 Encounter for screening for depression: Secondary | ICD-10-CM | POA: Diagnosis not present

## 2023-03-08 DIAGNOSIS — I10 Essential (primary) hypertension: Secondary | ICD-10-CM

## 2023-03-08 LAB — POCT CBG (FASTING - GLUCOSE)-MANUAL ENTRY: Glucose Fasting, POC: 158 mg/dL — AB (ref 70–99)

## 2023-03-08 MED ORDER — APIXABAN 5 MG PO TABS
5.0000 mg | ORAL_TABLET | Freq: Two times a day (BID) | ORAL | 2 refills | Status: DC
Start: 2023-03-08 — End: 2023-06-14

## 2023-03-08 MED ORDER — CHLORTHALIDONE 25 MG PO TABS: 25.0000 mg | ORAL_TABLET | Freq: Every morning | ORAL | 0 refills | Status: DC

## 2023-03-08 MED ORDER — FLUTICASONE PROPIONATE 50 MCG/ACT NA SUSP: 1.0000 | Freq: Every day | NASAL | 2 refills | Status: DC

## 2023-03-08 MED ORDER — ONETOUCH VERIO VI STRP: ORAL_STRIP | 3 refills | Status: DC

## 2023-03-08 MED ORDER — TRAZODONE HCL 50 MG PO TABS
50.0000 mg | ORAL_TABLET | Freq: Every evening | ORAL | 2 refills | Status: DC | PRN
Start: 2023-03-08 — End: 2023-06-14

## 2023-03-08 MED ORDER — CHLORTHALIDONE 25 MG PO TABS
25.0000 mg | ORAL_TABLET | Freq: Every morning | ORAL | 0 refills | Status: DC
Start: 2023-03-08 — End: 2023-03-08

## 2023-03-08 NOTE — Progress Notes (Signed)
Established Patient Office Visit  Subjective:  [name], male    DOB: 14-Jan-1956  Age: 67 y.o. MRN: [id number]  No chief complaint on file.   No new complaints, here for AWV refer to quality metrics and scanned documents.  Also here for lab review and medication refills. Labs reviewed and notable for well controlled diabetes, A1c improved at target, lipids at target with unremarkable cmp. Denies any hypoglycemic episodes and home bg readings have been at target.   No other concerns at this time.   Past Medical History:  Diagnosis Date   Arthritis    oa   COPD (chronic obstructive pulmonary disease) (HCC)    History of blood transfusion 1973   Hyperlipidemia    Hypertension    Pneumonia years ago   walking pneumonia   Shortness of breath dyspnea    with exertion    Past Surgical History:  Procedure Laterality Date   BACK SURGERY  1991   lower back   COLONOSCOPY WITH PROPOFOL N/A 05/31/2018   Procedure: COLONOSCOPY WITH PROPOFOL;  Surgeon: Toledo, Boykin Nearing, MD;  Location: [facility];  Service: Gastroenterology;  Laterality: N/A;   left shoulder rotator cuff repair  2001   surgery for staph infection  1973   both legs left worse than right, both legs open and drained   TOTAL HIP ARTHROPLASTY Left 02/11/2015   Procedure: TOTAL HIP ARTHROPLASTY ANTERIOR APPROACH;  Surgeon: Ollen Gross, MD;  Location: [facility];  Service: Orthopedics;  Laterality: Left;    Social History   Socioeconomic History   Marital status: Divorced    Spouse name: Not on file   Number of children: Not on file   Years of education: Not on file   Highest education level: Not on file  Occupational History   Not on file  Tobacco Use   Smoking status: Every Day    Current packs/day: 0.50    Average packs/day: 0.5 packs/day for 35.0 years (17.5 ttl pk-yrs)    Types: Cigarettes   Smokeless tobacco: Never  Substance and Sexual Activity   Alcohol use: Yes    Alcohol/week: 0.0 -  2.0 standard drinks of alcohol    Comment: social    Drug use: No   Sexual activity: Not on file  Other Topics Concern   Not on file  Social History Narrative   Not on file   Social Determinants of Health   Financial Resource Strain: Not on file  Food Insecurity: Not on file  Transportation Needs: Not on file  Physical Activity: Not on file  Stress: Not on file  Social Connections: Not on file  Intimate Partner Violence: Not on file    No family history on file.  No Known Allergies  Outpatient Medications Prior to Visit  Medication Sig   albuterol (VENTOLIN HFA) 108 (90 Base) MCG/ACT inhaler Inhale 2 puffs into the lungs every 6 (six) hours as needed for wheezing or shortness of breath.   apixaban (ELIQUIS) 5 MG TABS tablet Take 1 tablet by mouth twice daily   chlorthalidone (HYGROTON) 25 MG tablet Take 1 tablet (25 mg total) by mouth every morning.   glucose blood (ONETOUCH VERIO) test strip Use one strip to check sugars twice daily   metFORMIN (GLUCOPHAGE) 500 MG tablet Take 1 tablet by mouth twice daily   Multiple Vitamin (MULTIVITAMIN) tablet Take 1 tablet by mouth daily.   rosuvastatin (CRESTOR) 40 MG tablet Take 1 tablet by mouth once daily  traZODone (DESYREL) 50 MG tablet TAKE 1 TABLET BY MOUTH AT BEDTIME AS NEEDED   amiodarone (PACERONE) 400 MG tablet Take 1 tablet (400 mg total) by mouth daily. (Patient not taking: Reported on 09/08/2022)   ASPIRIN 81 PO Take 1 tablet by mouth daily. (Patient not taking: Reported on 06/05/2022)   cyclobenzaprine (FLEXERIL) 10 MG tablet Take 10 mg by mouth 3 (three) times daily as needed for muscle spasms. (Patient not taking: Reported on 06/05/2022)   diltiazem (CARDIZEM CD) 120 MG 24 hr capsule Take 1 capsule (120 mg total) by mouth daily. (Patient not taking: Reported on 06/05/2022)   fexofenadine (ALLEGRA) 60 MG tablet Take 60 mg by mouth 2 (two) times daily. (Patient not taking: Reported on 06/05/2022)   fluticasone (FLONASE) 50 MCG/ACT  nasal spray Place 1 spray into both nostrils daily.   metoprolol tartrate (LOPRESSOR) 100 MG tablet Take 1 tablet (100 mg total) by mouth 2 (two) times daily. (Patient not taking: Reported on 06/05/2022)   tiotropium (SPIRIVA) 18 MCG inhalation capsule Place 18 mcg into inhaler and inhale daily. (Patient not taking: Reported on 10/20/2022)   No facility-administered medications prior to visit.    Review of Systems  Constitutional:  Negative for malaise/fatigue.  HENT: Negative.    Eyes: Negative.   Respiratory: Negative.    Cardiovascular: Negative.   Gastrointestinal: Negative.   Genitourinary: Negative.   Musculoskeletal:  Positive for joint pain.  Skin: Negative.   Neurological: Negative.   Endo/Heme/Allergies: Negative.   Psychiatric/Behavioral:  The patient does not have insomnia.   All other systems reviewed and are negative.      Objective:   BP 121/82   Pulse (!) 132   Ht 5\' 9"  (1.753 m)   Wt 153 lb 6.4 oz (69.6 kg)   SpO2 97%   BMI 22.65 kg/m   Vitals:   03/08/23 1447  BP: 121/82  Pulse: (!) 132  Height: 5\' 9"  (1.753 m)  Weight: 153 lb 6.4 oz (69.6 kg)  SpO2: 97%  BMI (Calculated): 22.64    Physical Exam Vitals reviewed.  Constitutional:      Appearance: Normal appearance.  HENT:     Head: Normocephalic.     Left Ear: There is no impacted cerumen.     Nose: Nose normal.     Mouth/Throat:     Mouth: Mucous membranes are moist.     Pharynx: No posterior oropharyngeal erythema.  Eyes:     Extraocular Movements: Extraocular movements intact.     Pupils: Pupils are equal, round, and reactive to light.  Cardiovascular:     Rate and Rhythm: Regular rhythm.     Chest Wall: PMI is not displaced.     Pulses: Normal pulses.     Heart sounds: Normal heart sounds. No murmur heard. Pulmonary:     Effort: Pulmonary effort is normal.     Breath sounds: Normal air entry. Examination of the right-lower field reveals rhonchi. Examination of the left-lower field  reveals rhonchi. Wheezing and rhonchi present. No rales.     Comments: posteriorly Abdominal:     General: Abdomen is flat. Bowel sounds are normal. There is no distension.     Palpations: Abdomen is soft. There is no hepatomegaly, splenomegaly or mass.     Tenderness: There is no abdominal tenderness.  Musculoskeletal:        General: Normal range of motion.     Cervical back: Normal range of motion and neck supple.     Right lower  leg: No edema.     Left lower leg: No edema.  Skin:    General: Skin is warm and dry.  Neurological:     General: No focal deficit present.     Mental Status: He is alert and oriented to person, place, and time.     Cranial Nerves: No cranial nerve deficit.     Motor: No weakness.  Psychiatric:        Mood and Affect: Mood normal.        Behavior: Behavior normal.      Results for orders placed or performed in visit on 03/08/23  POCT CBG (Fasting - Glucose)  Result Value Ref Range   Glucose Fasting, POC 158 (A) 70 - 99 mg/dL    Recent Results (from the past 2160 hour(Charron Coultas))  Hepatitis C antibody     Status: None   Collection Time: 03/05/23 10:34 AM  Result Value Ref Range   Hep C Virus Ab Non Reactive Non Reactive    Comment: HCV antibody alone does not differentiate between previously resolved infection and active infection. Equivocal and Reactive HCV antibody results should be followed up with an HCV RNA test to support the diagnosis of active HCV infection.   Lipid panel     Status: None   Collection Time: 03/05/23 11:51 AM  Result Value Ref Range   Cholesterol, Total 107 100 - 199 mg/dL   Triglycerides 64 0 - 149 mg/dL   HDL 50 >82 mg/dL   VLDL Cholesterol Cal 14 5 - 40 mg/dL   LDL Chol Calc (NIH) 43 0 - 99 mg/dL   Chol/HDL Ratio 2.1 0.0 - 5.0 ratio    Comment:                                   T. Chol/HDL Ratio                                             Men  Women                               1/2 Avg.Risk  3.4    3.3                                    Avg.Risk  5.0    4.4                                2X Avg.Risk  9.6    7.1                                3X Avg.Risk 23.4   11.0   Hemoglobin A1c     Status: Abnormal   Collection Time: 03/05/23 11:51 AM  Result Value Ref Range   Hgb A1c MFr Bld 6.3 (H) 4.8 - 5.6 %    Comment:          Prediabetes: 5.7 - 6.4          Diabetes: >6.4  Glycemic control for adults with diabetes: <7.0    Est. average glucose Bld gHb Est-mCnc 134 mg/dL  POCT CBG (Fasting - Glucose)     Status: Abnormal   Collection Time: 03/08/23  2:53 PM  Result Value Ref Range   Glucose Fasting, POC 158 (A) 70 - 99 mg/dL      Assessment & Plan:  As per problem list. The current medical regimen is effective;  continue present plan and medications.   Problem List Items Addressed This Visit   None Visit Diagnoses     Type 2 diabetes mellitus without complication, without long-term current use of insulin (HCC)    -  Primary   Relevant Orders   POCT CBG (Fasting - Glucose) (Completed)       Return in about 3 months (around 06/06/2023) for fu with labs prior.   Total time spent: 30 minutes  Luna Fuse, MD  03/08/2023   This document may have been prepared by [facility] Voice Recognition software and as such may include unintentional dictation errors.

## 2023-03-15 ENCOUNTER — Other Ambulatory Visit: Payer: Self-pay | Admitting: Internal Medicine

## 2023-03-15 DIAGNOSIS — M1612 Unilateral primary osteoarthritis, left hip: Secondary | ICD-10-CM

## 2023-06-11 ENCOUNTER — Other Ambulatory Visit

## 2023-06-11 DIAGNOSIS — E119 Type 2 diabetes mellitus without complications: Secondary | ICD-10-CM | POA: Diagnosis not present

## 2023-06-11 DIAGNOSIS — E7800 Pure hypercholesterolemia, unspecified: Secondary | ICD-10-CM

## 2023-06-12 LAB — LIPID PANEL
Chol/HDL Ratio: 2.2 ratio (ref 0.0–5.0)
Cholesterol, Total: 109 mg/dL (ref 100–199)
HDL: 49 mg/dL
LDL Chol Calc (NIH): 46 mg/dL (ref 0–99)
Triglycerides: 63 mg/dL (ref 0–149)
VLDL Cholesterol Cal: 14 mg/dL (ref 5–40)

## 2023-06-12 LAB — HEMOGLOBIN A1C
Est. average glucose Bld gHb Est-mCnc: 140 mg/dL
Hgb A1c MFr Bld: 6.5 % — ABNORMAL HIGH (ref 4.8–5.6)

## 2023-06-14 ENCOUNTER — Ambulatory Visit (INDEPENDENT_AMBULATORY_CARE_PROVIDER_SITE_OTHER): Payer: Medicare HMO | Admitting: Internal Medicine

## 2023-06-14 VITALS — BP 118/76 | HR 73 | Temp 98.7°F | Ht 69.0 in | Wt 156.0 lb

## 2023-06-14 DIAGNOSIS — E119 Type 2 diabetes mellitus without complications: Secondary | ICD-10-CM | POA: Diagnosis not present

## 2023-06-14 DIAGNOSIS — M1612 Unilateral primary osteoarthritis, left hip: Secondary | ICD-10-CM | POA: Diagnosis not present

## 2023-06-14 DIAGNOSIS — I483 Typical atrial flutter: Secondary | ICD-10-CM

## 2023-06-14 DIAGNOSIS — J301 Allergic rhinitis due to pollen: Secondary | ICD-10-CM | POA: Diagnosis not present

## 2023-06-14 DIAGNOSIS — E7800 Pure hypercholesterolemia, unspecified: Secondary | ICD-10-CM

## 2023-06-14 DIAGNOSIS — I10 Essential (primary) hypertension: Secondary | ICD-10-CM

## 2023-06-14 DIAGNOSIS — F5101 Primary insomnia: Secondary | ICD-10-CM | POA: Diagnosis not present

## 2023-06-14 LAB — POCT CBG (FASTING - GLUCOSE)-MANUAL ENTRY: Glucose Fasting, POC: 113 mg/dL — AB (ref 70–99)

## 2023-06-14 MED ORDER — CELECOXIB 200 MG PO CAPS: 200.0000 mg | ORAL_CAPSULE | Freq: Every day | ORAL | 0 refills | Status: DC

## 2023-06-14 MED ORDER — APIXABAN 5 MG PO TABS: 5.0000 mg | ORAL_TABLET | Freq: Two times a day (BID) | ORAL | 2 refills | Status: DC

## 2023-06-14 MED ORDER — CHLORTHALIDONE 25 MG PO TABS: 25.0000 mg | ORAL_TABLET | Freq: Every morning | ORAL | 0 refills | Status: DC

## 2023-06-14 MED ORDER — ROSUVASTATIN CALCIUM 40 MG PO TABS: 40.0000 mg | ORAL_TABLET | Freq: Every day | ORAL | 0 refills | Status: DC

## 2023-06-14 MED ORDER — TRAZODONE HCL 50 MG PO TABS: 50.0000 mg | ORAL_TABLET | Freq: Every evening | ORAL | 2 refills | Status: DC | PRN

## 2023-06-14 MED ORDER — FLUTICASONE PROPIONATE 50 MCG/ACT NA SUSP: 1.0000 | Freq: Every day | NASAL | 2 refills | Status: DC

## 2023-06-14 NOTE — Progress Notes (Signed)
 Established Patient Office Visit  Subjective:  [name], male    DOB: 02-Aug-1955  Age: 68 y.o. MRN: [id number]  No chief complaint on file.   No new complaints, here for lab review and medication refills. Labs reviewed and notable for well controlled/uncontrolled diabetes, A1c not / at target, lipids at target with unremarkable cmp. Denies any hypoglycemic episodes and home bg readings have been at target.     No other concerns at this time.   Past Medical History:  Diagnosis Date   Arthritis    oa   COPD (chronic obstructive pulmonary disease) (HCC)    History of blood transfusion 1973   Hyperlipidemia    Hypertension    Pneumonia years ago   walking pneumonia   Shortness of breath dyspnea    with exertion    Past Surgical History:  Procedure Laterality Date   BACK SURGERY  1991   lower back   COLONOSCOPY WITH PROPOFOL N/A 05/31/2018   Procedure: COLONOSCOPY WITH PROPOFOL;  Surgeon: Toledo, Boykin Nearing, MD;  Location: [facility];  Service: Gastroenterology;  Laterality: N/A;   left shoulder rotator cuff repair  2001   surgery for staph infection  1973   both legs left worse than right, both legs open and drained   TOTAL HIP ARTHROPLASTY Left 02/11/2015   Procedure: TOTAL HIP ARTHROPLASTY ANTERIOR APPROACH;  Surgeon: Ollen Gross, MD;  Location: [facility];  Service: Orthopedics;  Laterality: Left;    Social History   Socioeconomic History   Marital status: Divorced    Spouse name: Not on file   Number of children: Not on file   Years of education: Not on file   Highest education level: Not on file  Occupational History   Not on file  Tobacco Use   Smoking status: Every Day    Current packs/day: 0.50    Average packs/day: 0.5 packs/day for 35.0 years (17.5 ttl pk-yrs)    Types: Cigarettes   Smokeless tobacco: Never  Substance and Sexual Activity   Alcohol use: Yes    Alcohol/week: 0.0 - 2.0 standard drinks of alcohol    Comment: social     Drug use: No   Sexual activity: Not on file  Other Topics Concern   Not on file  Social History Narrative   Not on file   Social Drivers of Health   Financial Resource Strain: Not on file  Food Insecurity: Not on file  Transportation Needs: Not on file  Physical Activity: Not on file  Stress: Not on file  Social Connections: Not on file  Intimate Partner Violence: Not on file    No family history on file.  No Known Allergies  Outpatient Medications Prior to Visit  Medication Sig   albuterol (VENTOLIN HFA) 108 (90 Base) MCG/ACT inhaler Inhale 2 puffs into the lungs every 6 (six) hours as needed for wheezing or shortness of breath.   glucose blood (ONETOUCH VERIO) test strip Use one strip to check sugars twice daily   metFORMIN (GLUCOPHAGE) 500 MG tablet Take 1 tablet by mouth twice daily   Multiple Vitamin (MULTIVITAMIN) tablet Take 1 tablet by mouth daily.   [DISCONTINUED] apixaban (ELIQUIS) 5 MG TABS tablet Take 1 tablet (5 mg total) by mouth 2 (two) times daily.   [DISCONTINUED] celecoxib (CELEBREX) 200 MG capsule Take 1 capsule by mouth once daily   [DISCONTINUED] chlorthalidone (HYGROTON) 25 MG tablet Take 1 tablet (25 mg total) by mouth every morning.   [DISCONTINUED]  rosuvastatin (CRESTOR) 40 MG tablet Take 1 tablet by mouth once daily   [DISCONTINUED] traZODone (DESYREL) 50 MG tablet Take 1 tablet (50 mg total) by mouth at bedtime as needed.   amiodarone (PACERONE) 400 MG tablet Take 1 tablet (400 mg total) by mouth daily. (Patient not taking: Reported on 09/08/2022)   ASPIRIN 81 PO Take 1 tablet by mouth daily. (Patient not taking: Reported on 06/05/2022)   cyclobenzaprine (FLEXERIL) 10 MG tablet Take 10 mg by mouth 3 (three) times daily as needed for muscle spasms. (Patient not taking: Reported on 06/05/2022)   diltiazem (CARDIZEM CD) 120 MG 24 hr capsule Take 1 capsule (120 mg total) by mouth daily. (Patient not taking: Reported on 06/14/2023)   fexofenadine (ALLEGRA) 60 MG  tablet Take 60 mg by mouth 2 (two) times daily. (Patient not taking: Reported on 06/05/2022)   metoprolol tartrate (LOPRESSOR) 100 MG tablet Take 1 tablet (100 mg total) by mouth 2 (two) times daily. (Patient not taking: Reported on 06/14/2023)   tiotropium (SPIRIVA) 18 MCG inhalation capsule Place 18 mcg into inhaler and inhale daily. (Patient not taking: Reported on 10/20/2022)   [DISCONTINUED] fluticasone (FLONASE) 50 MCG/ACT nasal spray Place 1 spray into both nostrils daily.   No facility-administered medications prior to visit.    Review of Systems  Constitutional:  Negative for malaise/fatigue.  HENT: Negative.    Eyes: Negative.   Respiratory: Negative.    Cardiovascular: Negative.   Gastrointestinal: Negative.   Genitourinary: Negative.   Musculoskeletal:  Positive for joint pain.  Skin: Negative.   Neurological: Negative.   Endo/Heme/Allergies: Negative.   Psychiatric/Behavioral:  The patient does not have insomnia.   All other systems reviewed and are negative.      Objective:   BP 118/76   Pulse 73   Temp 98.7 F (37.1 C)   Ht 5\' 9"  (1.753 m)   Wt 156 lb (70.8 kg)   SpO2 97%   BMI 23.04 kg/m   Vitals:   06/14/23 1538  BP: 118/76  Pulse: 73  Temp: 98.7 F (37.1 C)  Height: 5\' 9"  (1.753 m)  Weight: 156 lb (70.8 kg)  SpO2: 97%  BMI (Calculated): 23.03    Physical Exam Vitals reviewed.  Constitutional:      Appearance: Normal appearance.  HENT:     Head: Normocephalic.     Left Ear: There is no impacted cerumen.     Nose: Nose normal.     Mouth/Throat:     Mouth: Mucous membranes are moist.     Pharynx: No posterior oropharyngeal erythema.  Eyes:     Extraocular Movements: Extraocular movements intact.     Pupils: Pupils are equal, round, and reactive to light.  Cardiovascular:     Rate and Rhythm: Regular rhythm.     Chest Wall: PMI is not displaced.     Pulses: Normal pulses.     Heart sounds: Normal heart sounds. No murmur heard. Pulmonary:      Effort: Pulmonary effort is normal.     Breath sounds: Normal air entry. No rales.  Abdominal:     General: Abdomen is flat. Bowel sounds are normal. There is no distension.     Palpations: Abdomen is soft. There is no hepatomegaly, splenomegaly or mass.     Tenderness: There is no abdominal tenderness.  Musculoskeletal:        General: Normal range of motion.     Cervical back: Normal range of motion and neck supple.  Right lower leg: No edema.     Left lower leg: No edema.  Skin:    General: Skin is warm and dry.  Neurological:     General: No focal deficit present.     Mental Status: He is alert and oriented to person, place, and time.     Cranial Nerves: No cranial nerve deficit.     Motor: No weakness.  Psychiatric:        Mood and Affect: Mood normal.        Behavior: Behavior normal.      Results for orders placed or performed in visit on 06/14/23  POCT CBG (Fasting - Glucose)  Result Value Ref Range   Glucose Fasting, POC 113 (A) 70 - 99 mg/dL    Recent Results (from the past 2160 hours)  Lipid panel     Status: None   Collection Time: 06/11/23 11:00 AM  Result Value Ref Range   Cholesterol, Total 109 100 - 199 mg/dL   Triglycerides 63 0 - 149 mg/dL   HDL 49 >04 mg/dL   VLDL Cholesterol Cal 14 5 - 40 mg/dL   LDL Chol Calc (NIH) 46 0 - 99 mg/dL   Chol/HDL Ratio 2.2 0.0 - 5.0 ratio    Comment:                                   T. Chol/HDL Ratio                                             Men  Women                               1/2 Avg.Risk  3.4    3.3                                   Avg.Risk  5.0    4.4                                2X Avg.Risk  9.6    7.1                                3X Avg.Risk 23.4   11.0   Hemoglobin A1c     Status: Abnormal   Collection Time: 06/11/23 11:00 AM  Result Value Ref Range   Hgb A1c MFr Bld 6.5 (H) 4.8 - 5.6 %    Comment:          Prediabetes: 5.7 - 6.4          Diabetes: >6.4          Glycemic control for  adults with diabetes: <7.0    Est. average glucose Bld gHb Est-mCnc 140 mg/dL  POCT CBG (Fasting - Glucose)     Status: Abnormal   Collection Time: 06/14/23  3:41 PM  Result Value Ref Range   Glucose Fasting, POC 113 (A) 70 - 99 mg/dL      Assessment & Plan:  As per problem list  Problem List Items  Addressed This Visit       Cardiovascular and Mediastinum   Atrial flutter (HCC)   Relevant Medications   apixaban (ELIQUIS) 5 MG TABS tablet   chlorthalidone (HYGROTON) 25 MG tablet   rosuvastatin (CRESTOR) 40 MG tablet     Musculoskeletal and Integument   OA (osteoarthritis) of hip   Relevant Medications   celecoxib (CELEBREX) 200 MG capsule   Other Visit Diagnoses       Type 2 diabetes mellitus without complication, without long-term current use of insulin (HCC)    -  Primary   Relevant Medications   rosuvastatin (CRESTOR) 40 MG tablet   Other Relevant Orders   POCT CBG (Fasting - Glucose) (Completed)     Seasonal allergic rhinitis due to pollen       Relevant Medications   fluticasone (FLONASE) 50 MCG/ACT nasal spray     Primary hypertension       Relevant Medications   apixaban (ELIQUIS) 5 MG TABS tablet   chlorthalidone (HYGROTON) 25 MG tablet   rosuvastatin (CRESTOR) 40 MG tablet     Pure hypercholesterolemia       Relevant Medications   apixaban (ELIQUIS) 5 MG TABS tablet   chlorthalidone (HYGROTON) 25 MG tablet   rosuvastatin (CRESTOR) 40 MG tablet     Primary insomnia       Relevant Medications   traZODone (DESYREL) 50 MG tablet       Return in about 3 months (around 09/14/2023) for fu with labs prior.   Total time spent: 20 minutes  Luna Fuse, MD  06/14/2023   This document may have been prepared by [facility] Voice Recognition software and as such may include unintentional dictation errors.

## 2023-07-27 ENCOUNTER — Other Ambulatory Visit: Payer: Self-pay | Admitting: Internal Medicine

## 2023-09-07 ENCOUNTER — Other Ambulatory Visit: Payer: Self-pay | Admitting: Internal Medicine

## 2023-09-07 DIAGNOSIS — E7800 Pure hypercholesterolemia, unspecified: Secondary | ICD-10-CM

## 2023-09-15 ENCOUNTER — Other Ambulatory Visit: Payer: Self-pay | Admitting: Internal Medicine

## 2023-09-15 DIAGNOSIS — I483 Typical atrial flutter: Secondary | ICD-10-CM

## 2023-09-15 DIAGNOSIS — M1612 Unilateral primary osteoarthritis, left hip: Secondary | ICD-10-CM

## 2023-09-17 ENCOUNTER — Other Ambulatory Visit

## 2023-09-17 ENCOUNTER — Other Ambulatory Visit: Payer: Self-pay | Admitting: Internal Medicine

## 2023-09-17 DIAGNOSIS — E7800 Pure hypercholesterolemia, unspecified: Secondary | ICD-10-CM

## 2023-09-17 DIAGNOSIS — E119 Type 2 diabetes mellitus without complications: Secondary | ICD-10-CM

## 2023-09-17 DIAGNOSIS — I483 Typical atrial flutter: Secondary | ICD-10-CM

## 2023-09-17 DIAGNOSIS — M1612 Unilateral primary osteoarthritis, left hip: Secondary | ICD-10-CM

## 2023-09-17 NOTE — Telephone Encounter (Signed)
 Patient came in asking if his Tramadol  HCL 50mg  tab TEVA could be refilled as well please. Please advise

## 2023-09-18 LAB — LIPID PANEL
Chol/HDL Ratio: 2.5 ratio (ref 0.0–5.0)
Cholesterol, Total: 136 mg/dL (ref 100–199)
HDL: 55 mg/dL (ref >39)
LDL Chol Calc (NIH): 66 mg/dL (ref 0–99)
Triglycerides: 77 mg/dL (ref 0–149)
VLDL Cholesterol Cal: 15 mg/dL (ref 5–40)

## 2023-09-18 LAB — HEMOGLOBIN A1C
Est. average glucose Bld gHb Est-mCnc: 126 mg/dL
Hgb A1c MFr Bld: 6 % — ABNORMAL HIGH (ref 4.8–5.6)

## 2023-09-20 ENCOUNTER — Telehealth: Payer: Self-pay

## 2023-09-20 DIAGNOSIS — Z0100 Encounter for examination of eyes and vision without abnormal findings: Secondary | ICD-10-CM | POA: Diagnosis not present

## 2023-09-20 NOTE — Telephone Encounter (Signed)
 Patient is requesting rx for tramadol , look like he's previously taken it

## 2023-09-20 NOTE — Telephone Encounter (Signed)
 Patient has appt tomorrow and he will discuss it with you at that visit

## 2023-09-21 ENCOUNTER — Ambulatory Visit (INDEPENDENT_AMBULATORY_CARE_PROVIDER_SITE_OTHER): Admitting: Internal Medicine

## 2023-09-21 ENCOUNTER — Ambulatory Visit: Payer: Self-pay | Admitting: Internal Medicine

## 2023-09-21 VITALS — BP 120/71 | HR 78 | Temp 98.6°F | Ht 69.0 in | Wt 148.6 lb

## 2023-09-21 DIAGNOSIS — E7800 Pure hypercholesterolemia, unspecified: Secondary | ICD-10-CM | POA: Diagnosis not present

## 2023-09-21 DIAGNOSIS — S83511A Sprain of anterior cruciate ligament of right knee, initial encounter: Secondary | ICD-10-CM | POA: Diagnosis not present

## 2023-09-21 DIAGNOSIS — E119 Type 2 diabetes mellitus without complications: Secondary | ICD-10-CM

## 2023-09-21 DIAGNOSIS — M25561 Pain in right knee: Secondary | ICD-10-CM

## 2023-09-21 DIAGNOSIS — I10 Essential (primary) hypertension: Secondary | ICD-10-CM

## 2023-09-21 LAB — POCT CBG (FASTING - GLUCOSE)-MANUAL ENTRY: Glucose Fasting, POC: 82 mg/dL (ref 70–99)

## 2023-09-21 MED ORDER — CHLORTHALIDONE 25 MG PO TABS: 25.0000 mg | ORAL_TABLET | Freq: Every morning | ORAL | 0 refills | Status: DC

## 2023-09-21 MED ORDER — DICLOFENAC SODIUM 1 % EX GEL: 4.0000 g | Freq: Two times a day (BID) | CUTANEOUS | 0 refills | Status: AC

## 2023-09-21 MED ORDER — CELECOXIB 200 MG PO CAPS: 200.0000 mg | ORAL_CAPSULE | Freq: Two times a day (BID) | ORAL | 0 refills | Status: AC

## 2023-09-21 NOTE — Progress Notes (Signed)
 Established Patient Office Visit  Subjective:  [name], male    DOB: 1955/06/13  Age: 68 y.o. MRN: [id number]  Chief Complaint  Patient presents with   Follow-up    3 month lab results    No new complaints, here for lab review and medication refills. Right knee pain persists after twisting his knee a few weeks ago.   Labs reviewed and notable for well controlled diabetes, A1c at target and lipids also at target. Denies any hypoglycemic episodes and home bg readings have been at target.      No other concerns at this time.   Past Medical History:  Diagnosis Date   Arthritis    oa   COPD (chronic obstructive pulmonary disease) (HCC)    History of blood transfusion 1973   Hyperlipidemia    Hypertension    Pneumonia years ago   walking pneumonia   Shortness of breath dyspnea    with exertion    Past Surgical History:  Procedure Laterality Date   BACK SURGERY  1991   lower back   COLONOSCOPY WITH PROPOFOL  N/A 05/31/2018   Procedure: COLONOSCOPY WITH PROPOFOL ;  Surgeon: Toledo, Ladell POUR, MD;  Location: [facility];  Service: Gastroenterology;  Laterality: N/A;   left shoulder rotator cuff repair  2001   surgery for staph infection  1973   both legs left worse than right, both legs open and drained   TOTAL HIP ARTHROPLASTY Left 02/11/2015   Procedure: TOTAL HIP ARTHROPLASTY ANTERIOR APPROACH;  Surgeon: Dempsey Moan, MD;  Location: [facility];  Service: Orthopedics;  Laterality: Left;    Social History   Socioeconomic History   Marital status: Divorced    Spouse name: Not on file   Number of children: Not on file   Years of education: Not on file   Highest education level: Not on file  Occupational History   Not on file  Tobacco Use   Smoking status: Every Day    Current packs/day: 0.50    Average packs/day: 0.5 packs/day for 35.0 years (17.5 ttl pk-yrs)    Types: Cigarettes   Smokeless tobacco: Never  Substance and Sexual Activity   Alcohol  use: Yes    Alcohol/week: 0.0 - 2.0 standard drinks of alcohol    Comment: social    Drug use: No   Sexual activity: Not on file  Other Topics Concern   Not on file  Social History Narrative   Not on file   Social Drivers of Health   Financial Resource Strain: Not on file  Food Insecurity: Not on file  Transportation Needs: Not on file  Physical Activity: Not on file  Stress: Not on file  Social Connections: Not on file  Intimate Partner Violence: Not on file    No family history on file.  No Known Allergies  Outpatient Medications Prior to Visit  Medication Sig   albuterol  (VENTOLIN  HFA) 108 (90 Base) MCG/ACT inhaler Inhale 2 puffs into the lungs every 6 (six) hours as needed for wheezing or shortness of breath.   celecoxib  (CELEBREX ) 200 MG capsule Take 1 capsule by mouth once daily   [employer] TURMERIC CURCUMIN PO Take 250 mg by mouth daily.   ELIQUIS  5 MG TABS tablet Take 1 tablet by mouth twice daily   fluticasone  (FLONASE ) 50 MCG/ACT nasal spray Place 1 spray into both nostrils daily.   glucose blood (ONETOUCH VERIO) test strip Use one strip to check sugars twice daily   metFORMIN (GLUCOPHAGE)  500 MG tablet Take 1 tablet by mouth twice daily   Multiple Vitamin (MULTIVITAMIN) tablet Take 1 tablet by mouth daily.   rosuvastatin  (CRESTOR ) 40 MG tablet Take 1 tablet by mouth once daily   traZODone  (DESYREL ) 50 MG tablet Take 1 tablet (50 mg total) by mouth at bedtime as needed.   [DISCONTINUED] chlorthalidone  (HYGROTON ) 25 MG tablet Take 1 tablet (25 mg total) by mouth every morning.   amiodarone  (PACERONE ) 400 MG tablet Take 1 tablet (400 mg total) by mouth daily. (Patient not taking: Reported on 09/21/2023)   ASPIRIN  81 PO Take 1 tablet by mouth daily. (Patient not taking: Reported on 09/21/2023)   cyclobenzaprine  (FLEXERIL ) 10 MG tablet Take 10 mg by mouth 3 (three) times daily as needed for muscle spasms. (Patient not taking: Reported on 09/21/2023)   diltiazem  (CARDIZEM  CD) 120  MG 24 hr capsule Take 1 capsule (120 mg total) by mouth daily. (Patient not taking: Reported on 09/21/2023)   metoprolol  tartrate (LOPRESSOR ) 100 MG tablet Take 1 tablet (100 mg total) by mouth 2 (two) times daily. (Patient not taking: Reported on 09/21/2023)   tiotropium (SPIRIVA ) 18 MCG inhalation capsule Place 18 mcg into inhaler and inhale daily. (Patient not taking: Reported on 09/21/2023)   [DISCONTINUED] fexofenadine (ALLEGRA) 60 MG tablet Take 60 mg by mouth 2 (two) times daily. (Patient not taking: Reported on 09/21/2023)   No facility-administered medications prior to visit.    Review of Systems  Constitutional:  Positive for weight loss (8 lbs). Negative for malaise/fatigue.  HENT: Negative.    Eyes: Negative.   Respiratory: Negative.    Cardiovascular: Negative.   Gastrointestinal: Negative.   Genitourinary: Negative.   Musculoskeletal:  Positive for joint pain.  Skin: Negative.   Neurological: Negative.   Endo/Heme/Allergies: Negative.   Psychiatric/Behavioral:  The patient does not have insomnia.   All other systems reviewed and are negative.      Objective:   BP 120/71   Pulse 78   Temp 98.6 F (37 C)   Ht 5' 9 (1.753 m)   Wt 148 lb 9.6 oz (67.4 kg)   SpO2 94%   BMI 21.94 kg/m   Vitals:   09/21/23 1525  BP: 120/71  Pulse: 78  Temp: 98.6 F (37 C)  Height: 5' 9 (1.753 m)  Weight: 148 lb 9.6 oz (67.4 kg)  SpO2: 94%  BMI (Calculated): 21.93    Physical Exam Vitals reviewed.  Constitutional:      Appearance: Normal appearance.  HENT:     Head: Normocephalic.     Left Ear: There is no impacted cerumen.     Nose: Nose normal.     Mouth/Throat:     Mouth: Mucous membranes are moist.     Pharynx: No posterior oropharyngeal erythema.   Eyes:     Extraocular Movements: Extraocular movements intact.     Pupils: Pupils are equal, round, and reactive to light.    Cardiovascular:     Rate and Rhythm: Regular rhythm.     Chest Wall: PMI is not  displaced.     Pulses: Normal pulses.     Heart sounds: Normal heart sounds. No murmur heard. Pulmonary:     Effort: Pulmonary effort is normal.     Breath sounds: Normal air entry. No rales.  Abdominal:     General: Abdomen is flat. Bowel sounds are normal. There is no distension.     Palpations: Abdomen is soft. There is no hepatomegaly, splenomegaly or mass.  Tenderness: There is no abdominal tenderness.   Musculoskeletal:        General: Normal range of motion.     Cervical back: Normal range of motion and neck supple.     Right knee: Effusion present. Tenderness present. ACL laxity present.     Right lower leg: No edema.     Left lower leg: No edema.   Skin:    General: Skin is warm and dry.   Neurological:     General: No focal deficit present.     Mental Status: He is alert and oriented to person, place, and time.     Cranial Nerves: No cranial nerve deficit.     Motor: No weakness.   Psychiatric:        Mood and Affect: Mood normal.        Behavior: Behavior normal.      Results for orders placed or performed in visit on 09/21/23  POCT CBG (Fasting - Glucose)  Result Value Ref Range   Glucose Fasting, POC 82 70 - 99 mg/dL    Recent Results (from the past 2160 hours)  Lipid panel     Status: None   Collection Time: 09/17/23  9:00 AM  Result Value Ref Range   Cholesterol, Total 136 100 - 199 mg/dL   Triglycerides 77 0 - 149 mg/dL   HDL 55 >60 mg/dL   VLDL Cholesterol Cal 15 5 - 40 mg/dL   LDL Chol Calc (NIH) 66 0 - 99 mg/dL   Chol/HDL Ratio 2.5 0.0 - 5.0 ratio    Comment:                                   T. Chol/HDL Ratio                                             Men  Women                               1/2 Avg.Risk  3.4    3.3                                   Avg.Risk  5.0    4.4                                2X Avg.Risk  9.6    7.1                                3X Avg.Risk 23.4   11.0   Hemoglobin A1c     Status: Abnormal   Collection Time:  09/17/23  9:00 AM  Result Value Ref Range   Hgb A1c MFr Bld 6.0 (H) 4.8 - 5.6 %    Comment:          Prediabetes: 5.7 - 6.4          Diabetes: >6.4          Glycemic control for adults with diabetes: <7.0    Est. average glucose Bld gHb Est-mCnc  126 mg/dL  POCT CBG (Fasting - Glucose)     Status: None   Collection Time: 09/21/23  3:31 PM  Result Value Ref Range   Glucose Fasting, POC 82 70 - 99 mg/dL      Assessment & Plan:  As per problem list. Wear knee brace daily until he sees his orthopedic surgeon and take celebrex  bid for 2 wks. Problem List Items Addressed This Visit   None Visit Diagnoses       Type 2 diabetes mellitus without complication, without long-term current use of insulin  (HCC)    -  Primary   Relevant Medications   diclofenac Sodium (VOLTAREN) 1 % GEL   Other Relevant Orders   POCT CBG (Fasting - Glucose) (Completed)   Hemoglobin A1c     Sprain of anterior cruciate ligament of right knee, initial encounter       Relevant Medications   celecoxib  (CELEBREX ) 200 MG capsule   Other Relevant Orders   Ambulatory referral to Orthopedics     Primary hypertension       Relevant Medications   chlorthalidone  (HYGROTON ) 25 MG tablet     Pure hypercholesterolemia       Relevant Medications   chlorthalidone  (HYGROTON ) 25 MG tablet   Other Relevant Orders   Lipid panel       Return in about 3 months (around 12/22/2023) for fu with labs prior.   Total time spent: 20 minutes  Sherrill Cinderella Perry, MD  09/21/2023   This document may have been prepared by [facility] Voice Recognition software and as such may include unintentional dictation errors.

## 2023-10-21 ENCOUNTER — Other Ambulatory Visit: Payer: Self-pay | Admitting: Internal Medicine

## 2023-10-21 DIAGNOSIS — I483 Typical atrial flutter: Secondary | ICD-10-CM

## 2023-10-22 ENCOUNTER — Other Ambulatory Visit: Payer: Self-pay | Admitting: Internal Medicine

## 2023-10-31 DIAGNOSIS — R42 Dizziness and giddiness: Secondary | ICD-10-CM | POA: Diagnosis not present

## 2023-10-31 DIAGNOSIS — I4892 Unspecified atrial flutter: Secondary | ICD-10-CM | POA: Diagnosis not present

## 2023-11-02 ENCOUNTER — Other Ambulatory Visit: Payer: Self-pay | Admitting: Internal Medicine

## 2023-11-02 DIAGNOSIS — M1711 Unilateral primary osteoarthritis, right knee: Secondary | ICD-10-CM | POA: Diagnosis not present

## 2023-11-02 DIAGNOSIS — F5101 Primary insomnia: Secondary | ICD-10-CM

## 2023-11-25 ENCOUNTER — Other Ambulatory Visit: Payer: Self-pay | Admitting: Internal Medicine

## 2023-11-25 DIAGNOSIS — M1612 Unilateral primary osteoarthritis, left hip: Secondary | ICD-10-CM

## 2023-12-02 ENCOUNTER — Other Ambulatory Visit: Payer: Self-pay | Admitting: Internal Medicine

## 2023-12-02 DIAGNOSIS — E7800 Pure hypercholesterolemia, unspecified: Secondary | ICD-10-CM

## 2023-12-03 ENCOUNTER — Other Ambulatory Visit

## 2023-12-03 DIAGNOSIS — E7800 Pure hypercholesterolemia, unspecified: Secondary | ICD-10-CM | POA: Diagnosis not present

## 2023-12-03 DIAGNOSIS — E119 Type 2 diabetes mellitus without complications: Secondary | ICD-10-CM | POA: Diagnosis not present

## 2023-12-04 LAB — HEMOGLOBIN A1C
Est. average glucose Bld gHb Est-mCnc: 131 mg/dL
Hgb A1c MFr Bld: 6.2 % — ABNORMAL HIGH (ref 4.8–5.6)

## 2023-12-04 LAB — LIPID PANEL
Chol/HDL Ratio: 2.4 ratio (ref 0.0–5.0)
Cholesterol, Total: 132 mg/dL (ref 100–199)
HDL: 55 mg/dL (ref >39)
LDL Chol Calc (NIH): 65 mg/dL (ref 0–99)
Triglycerides: 53 mg/dL (ref 0–149)
VLDL Cholesterol Cal: 12 mg/dL (ref 5–40)

## 2023-12-24 ENCOUNTER — Ambulatory Visit: Payer: Self-pay | Admitting: Internal Medicine

## 2023-12-24 ENCOUNTER — Ambulatory Visit: Admitting: Internal Medicine

## 2023-12-24 VITALS — BP 120/66 | HR 76 | Temp 98.6°F | Ht 69.0 in | Wt 150.4 lb

## 2023-12-24 DIAGNOSIS — J301 Allergic rhinitis due to pollen: Secondary | ICD-10-CM | POA: Diagnosis not present

## 2023-12-24 DIAGNOSIS — F5101 Primary insomnia: Secondary | ICD-10-CM

## 2023-12-24 DIAGNOSIS — E119 Type 2 diabetes mellitus without complications: Secondary | ICD-10-CM | POA: Diagnosis not present

## 2023-12-24 DIAGNOSIS — I10 Essential (primary) hypertension: Secondary | ICD-10-CM | POA: Diagnosis not present

## 2023-12-24 DIAGNOSIS — E7800 Pure hypercholesterolemia, unspecified: Secondary | ICD-10-CM | POA: Diagnosis not present

## 2023-12-24 DIAGNOSIS — I483 Typical atrial flutter: Secondary | ICD-10-CM | POA: Diagnosis not present

## 2023-12-24 LAB — POC CREATINE & ALBUMIN,URINE
Albumin/Creatinine Ratio, Urine, POC: <30
Creatinine, POC: 50 mg/dL
Microalbumin Ur, POC: 10 mg/L

## 2023-12-24 LAB — POCT CBG (FASTING - GLUCOSE)-MANUAL ENTRY: Glucose Fasting, POC: 171 mg/dL — AB (ref 70–99)

## 2023-12-24 MED ORDER — CHLORTHALIDONE 25 MG PO TABS: 25.0000 mg | ORAL_TABLET | Freq: Every morning | ORAL | 0 refills | Status: DC

## 2023-12-24 MED ORDER — TRAZODONE HCL 50 MG PO TABS: 50.0000 mg | ORAL_TABLET | Freq: Every evening | ORAL | 1 refills | Status: DC | PRN

## 2023-12-24 MED ORDER — FLUTICASONE PROPIONATE 50 MCG/ACT NA SUSP: 1.0000 | Freq: Every day | NASAL | 2 refills | Status: AC

## 2023-12-24 MED ORDER — METFORMIN HCL 500 MG PO TABS: 500.0000 mg | ORAL_TABLET | Freq: Two times a day (BID) | ORAL | 0 refills | Status: DC

## 2023-12-24 MED ORDER — APIXABAN 5 MG PO TABS: 5.0000 mg | ORAL_TABLET | Freq: Two times a day (BID) | ORAL | 2 refills | Status: DC

## 2023-12-24 NOTE — Progress Notes (Signed)
 Established Patient Office Visit  Subjective:  [name], male    DOB: 10/29/1955  Age: 68 y.o. MRN: [id number]  Chief Complaint  Patient presents with   Follow-up    3 month lab results    No new complaints, here for lab review and medication refills. Labs reviewed and notable for well controlled diabetes, A1c higher but at target, lipids at target with unremarkable cmp. Denies any hypoglycemic episodes and home bg readings have been at target.     No other concerns at this time.   Past Medical History:  Diagnosis Date   Arthritis    oa   COPD (chronic obstructive pulmonary disease) (HCC)    History of blood transfusion 1973   Hyperlipidemia    Hypertension    Pneumonia years ago   walking pneumonia   Shortness of breath dyspnea    with exertion    Past Surgical History:  Procedure Laterality Date   BACK SURGERY  1991   lower back   COLONOSCOPY WITH PROPOFOL  N/A 05/31/2018   Procedure: COLONOSCOPY WITH PROPOFOL ;  Surgeon: Toledo, Ladell POUR, MD;  Location: [facility];  Service: Gastroenterology;  Laterality: N/A;   left shoulder rotator cuff repair  2001   surgery for staph infection  1973   both legs left worse than right, both legs open and drained   TOTAL HIP ARTHROPLASTY Left 02/11/2015   Procedure: TOTAL HIP ARTHROPLASTY ANTERIOR APPROACH;  Surgeon: Dempsey Moan, MD;  Location: [facility];  Service: Orthopedics;  Laterality: Left;    Social History   Socioeconomic History   Marital status: Divorced    Spouse name: Not on file   Number of children: Not on file   Years of education: Not on file   Highest education level: Not on file  Occupational History   Not on file  Tobacco Use   Smoking status: Every Day    Current packs/day: 0.50    Average packs/day: 0.5 packs/day for 35.0 years (17.5 ttl pk-yrs)    Types: Cigarettes   Smokeless tobacco: Never  Substance and Sexual Activity   Alcohol use: Yes    Alcohol/week: 0.0 - 2.0 standard  drinks of alcohol    Comment: social    Drug use: No   Sexual activity: Not on file  Other Topics Concern   Not on file  Social History Narrative   Not on file   Social Drivers of Health   Financial Resource Strain: Low Risk  (10/31/2023)   Received from [facility]   Overall Financial Resource Strain (CARDIA)    Difficulty of Paying Living Expenses: Not hard at all  Food Insecurity: No Food Insecurity (10/31/2023)   Received from [facility]   Hunger Vital Sign    Within the past 12 months, you worried that your food would run out before you got the money to buy more.: Never true    Within the past 12 months, the food you bought just didn't last and you didn't have money to get more.: Never true  Transportation Needs: No Transportation Needs (10/31/2023)   Received from [facility]    In the past 12 months, has lack of transportation kept you from medical appointments or from getting medications?: No    Lack of Transportation (Non-Medical): No  Physical Activity: Not on file  Stress: Not on file  Social Connections: Not on file  Intimate Partner Violence: Not on  file    No family history on file.  No Known Allergies  Outpatient Medications Prior to Visit  Medication Sig   albuterol  (VENTOLIN  HFA) 108 (90 Base) MCG/ACT inhaler Inhale 2 puffs into the lungs every 6 (six) hours as needed for wheezing or shortness of breath.   celecoxib  (CELEBREX ) 200 MG capsule Take 1 capsule by mouth once daily   Cholecalciferol (VITAMIN D-3) 125 MCG (5000 UT) TABS Take 5,000 Units by mouth daily.   glucose blood (ONETOUCH VERIO) test strip Use one strip to check sugars twice daily   Homeopathic Products (LEG CRAMPS PO) Take 10 mg by mouth as needed.   methocarbamol  (ROBAXIN ) 500 MG tablet Take 500 mg by mouth as needed for muscle spasms.   Multiple Vitamin (MULTIVITAMIN) tablet Take 1 tablet by mouth daily.    rosuvastatin  (CRESTOR ) 40 MG tablet Take 1 tablet by mouth once daily   traMADol  (ULTRAM ) 50 MG tablet Take 50 mg by mouth every 6 (six) hours as needed.   [DISCONTINUED] apixaban  (ELIQUIS ) 5 MG TABS tablet Take 1 tablet by mouth twice daily   [DISCONTINUED] chlorthalidone  (HYGROTON ) 25 MG tablet Take 1 tablet (25 mg total) by mouth every morning.   [DISCONTINUED] fluticasone  (FLONASE ) 50 MCG/ACT nasal spray Place 1 spray into both nostrils daily.   [DISCONTINUED] metFORMIN  (GLUCOPHAGE ) 500 MG tablet Take 1 tablet by mouth twice daily   [DISCONTINUED] traZODone  (DESYREL ) 50 MG tablet TAKE 1 TABLET BY MOUTH AT BEDTIME AS NEEDED   amiodarone  (PACERONE ) 400 MG tablet Take 1 tablet (400 mg total) by mouth daily. (Patient not taking: Reported on 12/24/2023)   ASPIRIN  81 PO Take 1 tablet by mouth daily. (Patient not taking: Reported on 12/24/2023)   [employer] TURMERIC CURCUMIN PO Take 250 mg by mouth daily.   cyclobenzaprine  (FLEXERIL ) 10 MG tablet Take 10 mg by mouth 3 (three) times daily as needed for muscle spasms. (Patient not taking: Reported on 12/24/2023)   diltiazem  (CARDIZEM  CD) 120 MG 24 hr capsule Take 1 capsule (120 mg total) by mouth daily. (Patient not taking: Reported on 12/24/2023)   metoprolol  tartrate (LOPRESSOR ) 100 MG tablet Take 1 tablet (100 mg total) by mouth 2 (two) times daily. (Patient not taking: Reported on 12/24/2023)   tiotropium (SPIRIVA ) 18 MCG inhalation capsule Place 18 mcg into inhaler and inhale daily. (Patient not taking: Reported on 12/24/2023)   No facility-administered medications prior to visit.    Review of Systems  Constitutional:  Negative for malaise/fatigue and weight loss (gained 2 lbs).  HENT: Negative.    Eyes: Negative.   Respiratory: Negative.    Cardiovascular: Negative.   Gastrointestinal: Negative.   Genitourinary: Negative.   Musculoskeletal:  Positive for joint pain.  Skin: Negative.   Neurological: Negative.   Endo/Heme/Allergies: Negative.    Psychiatric/Behavioral:  The patient does not have insomnia.   All other systems reviewed and are negative.      Objective:   BP 120/66   Pulse 76   Temp 98.6 F (37 C)   Ht 5' 9 (1.753 m)   Wt 150 lb 6.4 oz (68.2 kg)   SpO2 95%   BMI 22.21 kg/m   Vitals:   12/24/23 1521  BP: 120/66  Pulse: 76  Temp: 98.6 F (37 C)  Height: 5' 9 (1.753 m)  Weight: 150 lb 6.4 oz (68.2 kg)  SpO2: 95%  BMI (Calculated): 22.2    Physical Exam Vitals reviewed.  Constitutional:      Appearance: Normal appearance.  HENT:  Head: Normocephalic.     Left Ear: There is no impacted cerumen.     Nose: Nose normal.     Mouth/Throat:     Mouth: Mucous membranes are moist.     Pharynx: No posterior oropharyngeal erythema.  Eyes:     Extraocular Movements: Extraocular movements intact.     Pupils: Pupils are equal, round, and reactive to light.  Cardiovascular:     Rate and Rhythm: Regular rhythm.     Chest Wall: PMI is not displaced.     Pulses: Normal pulses.     Heart sounds: Normal heart sounds. No murmur heard. Pulmonary:     Effort: Pulmonary effort is normal.     Breath sounds: Normal air entry. No rales.  Abdominal:     General: Abdomen is flat. Bowel sounds are normal. There is no distension.     Palpations: Abdomen is soft. There is no hepatomegaly, splenomegaly or mass.     Tenderness: There is no abdominal tenderness.  Musculoskeletal:        General: Normal range of motion.     Cervical back: Normal range of motion and neck supple.     Right knee: Effusion present. Tenderness present. ACL laxity present.     Right lower leg: No edema.     Left lower leg: No edema.  Skin:    General: Skin is warm and dry.  Neurological:     General: No focal deficit present.     Mental Status: He is alert and oriented to person, place, and time.     Cranial Nerves: No cranial nerve deficit.     Motor: No weakness.  Psychiatric:        Mood and Affect: Mood normal.         Behavior: Behavior normal.      Results for orders placed or performed in visit on 12/24/23  POCT CBG (Fasting - Glucose)  Result Value Ref Range   Glucose Fasting, POC 171 (A) 70 - 99 mg/dL  POC CREATINE & ALBUMIN,URINE  Result Value Ref Range   Microalbumin Ur, POC 10 mg/L   Creatinine, POC 50 mg/dL   Albumin/Creatinine Ratio, Urine, POC <30     Recent Results (from the past 2160 hours)  Lipid panel     Status: None   Collection Time: 12/03/23  9:55 AM  Result Value Ref Range   Cholesterol, Total 132 100 - 199 mg/dL   Triglycerides 53 0 - 149 mg/dL   HDL 55 >60 mg/dL   VLDL Cholesterol Cal 12 5 - 40 mg/dL   LDL Chol Calc (NIH) 65 0 - 99 mg/dL   Chol/HDL Ratio 2.4 0.0 - 5.0 ratio    Comment:                                   T. Chol/HDL Ratio                                             Men  Women                               1/2 Avg.Risk  3.4    3.3  Avg.Risk  5.0    4.4                                2X Avg.Risk  9.6    7.1                                3X Avg.Risk 23.4   11.0   Hemoglobin A1c     Status: Abnormal   Collection Time: 12/03/23  9:56 AM  Result Value Ref Range   Hgb A1c MFr Bld 6.2 (H) 4.8 - 5.6 %    Comment:          Prediabetes: 5.7 - 6.4          Diabetes: >6.4          Glycemic control for adults with diabetes: <7.0    Est. average glucose Bld gHb Est-mCnc 131 mg/dL  POCT CBG (Fasting - Glucose)     Status: Abnormal   Collection Time: 12/24/23  3:31 PM  Result Value Ref Range   Glucose Fasting, POC 171 (A) 70 - 99 mg/dL  POC CREATINE & ALBUMIN,URINE     Status: Normal   Collection Time: 12/24/23  3:59 PM  Result Value Ref Range   Microalbumin Ur, POC 10 mg/L   Creatinine, POC 50 mg/dL   Albumin/Creatinine Ratio, Urine, POC <30       Assessment & Plan:   Problem List Items Addressed This Visit       Cardiovascular and Mediastinum   Atrial flutter (HCC)   Relevant Medications   apixaban  (ELIQUIS ) 5 MG  TABS tablet   chlorthalidone  (HYGROTON ) 25 MG tablet   Other Visit Diagnoses       Type 2 diabetes mellitus without complication, without long-term current use of insulin  (HCC)    -  Primary   Relevant Medications   metFORMIN  (GLUCOPHAGE ) 500 MG tablet   Other Relevant Orders   POCT CBG (Fasting - Glucose) (Completed)   POC CREATINE & ALBUMIN,URINE (Completed)     Seasonal allergic rhinitis due to pollen       Relevant Medications   fluticasone  (FLONASE ) 50 MCG/ACT nasal spray     Primary hypertension       Relevant Medications   apixaban  (ELIQUIS ) 5 MG TABS tablet   chlorthalidone  (HYGROTON ) 25 MG tablet   Other Relevant Orders   Uric acid     Primary insomnia       Relevant Medications   traZODone  (DESYREL ) 50 MG tablet     Pure hypercholesterolemia       Relevant Medications   apixaban  (ELIQUIS ) 5 MG TABS tablet   chlorthalidone  (HYGROTON ) 25 MG tablet       Return in about 3 months (around 03/24/2024) for awv with labs prior.   Total time spent: 20 minutes  Sherrill Cinderella Perry, MD  12/24/2023   This document may have been prepared by [facility] Voice Recognition software and as such may include unintentional dictation errors.

## 2024-01-14 ENCOUNTER — Telehealth: Payer: Self-pay

## 2024-01-14 NOTE — Telephone Encounter (Signed)
 Patient informed will come Monday to get it

## 2024-01-14 NOTE — Telephone Encounter (Signed)
 Need to  call pt and let him know his handicap paper is ready for pickup

## 2024-02-28 ENCOUNTER — Other Ambulatory Visit: Payer: Self-pay | Admitting: Internal Medicine

## 2024-02-28 ENCOUNTER — Telehealth: Payer: Self-pay

## 2024-02-28 DIAGNOSIS — E119 Type 2 diabetes mellitus without complications: Secondary | ICD-10-CM

## 2024-02-28 NOTE — Telephone Encounter (Signed)
 Patient insurance will no longer cover the OneTouch Verio for diabetes supplies starting 03/30/24, can you send in the AccuChek for the patient

## 2024-03-02 DIAGNOSIS — B9689 Other specified bacterial agents as the cause of diseases classified elsewhere: Secondary | ICD-10-CM | POA: Diagnosis not present

## 2024-03-02 DIAGNOSIS — Z03818 Encounter for observation for suspected exposure to other biological agents ruled out: Secondary | ICD-10-CM | POA: Diagnosis not present

## 2024-03-02 DIAGNOSIS — J209 Acute bronchitis, unspecified: Secondary | ICD-10-CM | POA: Diagnosis not present

## 2024-03-02 DIAGNOSIS — J441 Chronic obstructive pulmonary disease with (acute) exacerbation: Secondary | ICD-10-CM | POA: Diagnosis not present

## 2024-03-02 DIAGNOSIS — J9801 Acute bronchospasm: Secondary | ICD-10-CM | POA: Diagnosis not present

## 2024-03-02 DIAGNOSIS — J019 Acute sinusitis, unspecified: Secondary | ICD-10-CM | POA: Diagnosis not present

## 2024-03-08 ENCOUNTER — Other Ambulatory Visit: Payer: Self-pay | Admitting: Internal Medicine

## 2024-03-08 DIAGNOSIS — E119 Type 2 diabetes mellitus without complications: Secondary | ICD-10-CM

## 2024-03-08 MED ORDER — ACCU-CHEK GUIDE TEST VI STRP: ORAL_STRIP | 12 refills | Status: AC

## 2024-03-08 MED ORDER — ACCU-CHEK GUIDE W/DEVICE KIT: 1.0000 | PACK | Freq: Every day | 0 refills | Status: AC

## 2024-03-08 MED ORDER — ACCU-CHEK FASTCLIX LANCET KIT: 1.0000 | PACK | Freq: Every day | 0 refills | Status: AC

## 2024-03-08 MED ORDER — ACCU-CHEK SOFTCLIX LANCETS MISC: 12 refills | Status: AC

## 2024-03-20 ENCOUNTER — Other Ambulatory Visit: Payer: Self-pay | Admitting: Internal Medicine

## 2024-03-20 DIAGNOSIS — I483 Typical atrial flutter: Secondary | ICD-10-CM

## 2024-03-20 DIAGNOSIS — F5101 Primary insomnia: Secondary | ICD-10-CM

## 2024-04-06 ENCOUNTER — Other Ambulatory Visit

## 2024-04-07 ENCOUNTER — Ambulatory Visit: Admitting: Internal Medicine

## 2024-04-07 ENCOUNTER — Ambulatory Visit: Payer: Self-pay | Admitting: Internal Medicine

## 2024-04-07 VITALS — BP 122/64 | HR 62 | Ht 69.0 in | Wt 152.7 lb

## 2024-04-07 DIAGNOSIS — B351 Tinea unguium: Secondary | ICD-10-CM

## 2024-04-07 DIAGNOSIS — I483 Typical atrial flutter: Secondary | ICD-10-CM

## 2024-04-07 DIAGNOSIS — E7800 Pure hypercholesterolemia, unspecified: Secondary | ICD-10-CM | POA: Diagnosis not present

## 2024-04-07 DIAGNOSIS — F5101 Primary insomnia: Secondary | ICD-10-CM

## 2024-04-07 DIAGNOSIS — Z0001 Encounter for general adult medical examination with abnormal findings: Secondary | ICD-10-CM

## 2024-04-07 DIAGNOSIS — I10 Essential (primary) hypertension: Secondary | ICD-10-CM | POA: Diagnosis not present

## 2024-04-07 DIAGNOSIS — E1165 Type 2 diabetes mellitus with hyperglycemia: Secondary | ICD-10-CM

## 2024-04-07 DIAGNOSIS — E119 Type 2 diabetes mellitus without complications: Secondary | ICD-10-CM

## 2024-04-07 LAB — HEMOGLOBIN A1C
Est. average glucose Bld gHb Est-mCnc: 137 mg/dL
Hgb A1c MFr Bld: 6.4 % — ABNORMAL HIGH (ref 4.8–5.6)

## 2024-04-07 LAB — LIPID PANEL
Chol/HDL Ratio: 2 ratio (ref 0.0–5.0)
Cholesterol, Total: 129 mg/dL (ref 100–199)
HDL: 63 mg/dL
LDL Chol Calc (NIH): 51 mg/dL (ref 0–99)
Triglycerides: 75 mg/dL (ref 0–149)
VLDL Cholesterol Cal: 15 mg/dL (ref 5–40)

## 2024-04-07 LAB — URIC ACID: Uric Acid: 5.5 mg/dL (ref 3.8–8.4)

## 2024-04-07 LAB — POCT CBG (FASTING - GLUCOSE)-MANUAL ENTRY: Glucose Fasting, POC: 92 mg/dL (ref 70–99)

## 2024-04-07 LAB — POC CREATINE & ALBUMIN,URINE
Creatinine, POC: 50 mg/dL
Microalbumin Ur, POC: 30 mg/L

## 2024-04-07 MED ORDER — METFORMIN HCL 500 MG PO TABS: 500.0000 mg | ORAL_TABLET | Freq: Two times a day (BID) | ORAL | 0 refills | Status: AC

## 2024-04-07 MED ORDER — APIXABAN 5 MG PO TABS: 5.0000 mg | ORAL_TABLET | Freq: Two times a day (BID) | ORAL | 1 refills | Status: AC

## 2024-04-07 MED ORDER — TRAZODONE HCL 50 MG PO TABS: 50.0000 mg | ORAL_TABLET | Freq: Every evening | ORAL | 0 refills | Status: AC | PRN

## 2024-04-07 MED ORDER — CHLORTHALIDONE 25 MG PO TABS: 25.0000 mg | ORAL_TABLET | Freq: Every morning | ORAL | 0 refills | Status: AC

## 2024-04-07 MED ORDER — TERBINAFINE HCL 250 MG PO TABS: 250.0000 mg | ORAL_TABLET | Freq: Every day | ORAL | 0 refills | Status: AC

## 2024-04-07 MED ORDER — EMPAGLIFLOZIN 10 MG PO TABS: 10.0000 mg | ORAL_TABLET | Freq: Every day | ORAL | 0 refills | Status: AC

## 2024-04-07 NOTE — Progress Notes (Unsigned)
 "  Established Patient Office Visit  Subjective:  [name], male    DOB: 1955-11-30  Age: 69 y.o. MRN: [id number]  Chief Complaint  Patient presents with   Annual Exam    AWV    No new complaints, here for AWV refer to quality metrics and scanned documents. Also here for lab review and medication refills. Labs reviewed and notable for well controlled diabetes, A1c at target, lipids at target with unremarkable uric acid. Denies any hypoglycemic episodes and home bg readings have been at target.      No other concerns at this time.   Past Medical History:  Diagnosis Date   Arthritis    oa   COPD (chronic obstructive pulmonary disease) (HCC)    History of blood transfusion 1973   Hyperlipidemia    Hypertension    Pneumonia years ago   walking pneumonia   Shortness of breath dyspnea    with exertion    Past Surgical History:  Procedure Laterality Date   BACK SURGERY  1991   lower back   COLONOSCOPY WITH PROPOFOL  N/A 05/31/2018   Procedure: COLONOSCOPY WITH PROPOFOL ;  Surgeon: Toledo, Ladell POUR, MD;  Location: [facility];  Service: Gastroenterology;  Laterality: N/A;   left shoulder rotator cuff repair  2001   surgery for staph infection  1973   both legs left worse than right, both legs open and drained   TOTAL HIP ARTHROPLASTY Left 02/11/2015   Procedure: TOTAL HIP ARTHROPLASTY ANTERIOR APPROACH;  Surgeon: Dempsey Moan, MD;  Location: [facility];  Service: Orthopedics;  Laterality: Left;    Social History   Socioeconomic History   Marital status: Divorced    Spouse name: Not on file   Number of children: Not on file   Years of education: Not on file   Highest education level: Not on file  Occupational History   Not on file  Tobacco Use   Smoking status: Every Day    Current packs/day: 0.50    Average packs/day: 0.5 packs/day for 35.0 years (17.5 ttl pk-yrs)    Types: Cigarettes   Smokeless tobacco: Never  Substance and  Sexual Activity   Alcohol use: Yes    Alcohol/week: 0.0 - 2.0 standard drinks of alcohol    Comment: social    Drug use: No   Sexual activity: Not on file  Other Topics Concern   Not on file  Social History Narrative   Not on file   Social Drivers of Health   Tobacco Use: High Risk (03/02/2024)   Received from [facility]   Patient History    Smoking Tobacco Use: Every Day    Smokeless Tobacco Use: Never    Passive Exposure: Current  Financial Resource Strain: Low Risk  (10/31/2023)   Received from [facility]   Overall Financial Resource Strain (CARDIA)    Difficulty of Paying Living Expenses: Not hard at all  Food Insecurity: No Food Insecurity (10/31/2023)   Received from [facility]   Epic    Within the past 12 months, you worried that your food would run out before you got the money to buy more.: Never true    Within the past 12 months, the food you bought just didn't last and you didn't have money to get more.: Never true  Transportation Needs: No Transportation Needs (10/31/2023)   Received from [facility] - Transportation    In the past  12 months, has lack of transportation kept you from medical appointments or from getting medications?: No    Lack of Transportation (Non-Medical): No  Physical Activity: Not on file  Stress: Not on file  Social Connections: Not on file  Intimate Partner Violence: Not on file  Depression (PHQ2-9): Low Risk (03/10/2023)   Depression (PHQ2-9)    PHQ-2 Score: 3  Alcohol Screen: Not on file  Housing: Low Risk  (10/31/2023)   Received from [facility]   Epic    In the last 12 months, was there a time when you were not able to pay the mortgage or rent on time?: No    In the past 12 months, how many times have you moved where you were living?: 0    At any time in the past 12 months, were you homeless or living in a shelter  (including now)?: No  Utilities: Not At Risk (10/31/2023)   Received from [facility]   Epic    In the past 12 months has the electric, gas, oil, or water  company threatened to shut off services in your home?: No  Health Literacy: Not on file    No family history on file.  Allergies[1]  Show/hide medication list[2]  Review of Systems  Constitutional:  Negative for malaise/fatigue and weight loss (gained 2 lbs).  HENT: Negative.    Eyes: Negative.   Respiratory: Negative.    Cardiovascular: Negative.   Gastrointestinal: Negative.   Genitourinary: Negative.   Musculoskeletal:  Positive for joint pain.  Skin: Negative.   Neurological: Negative.   Endo/Heme/Allergies: Negative.   Psychiatric/Behavioral:  The patient does not have insomnia.   All other systems reviewed and are negative.      Objective:   BP 122/64   Pulse 62   Ht 5' 9 (1.753 m)   Wt 152 lb 11.2 oz (69.3 kg)   SpO2 100%   BMI 22.55 kg/m   Vitals:   04/07/24 1448  BP: 122/64  Pulse: 62  Height: 5' 9 (1.753 m)  Weight: 152 lb 11.2 oz (69.3 kg)  SpO2: 100%  BMI (Calculated): 22.54    Physical Exam Vitals reviewed.  Constitutional:      Appearance: Normal appearance.  HENT:     Head: Normocephalic.     Left Ear: There is no impacted cerumen.     Nose: Nose normal.     Mouth/Throat:     Mouth: Mucous membranes are moist.     Pharynx: No posterior oropharyngeal erythema.  Eyes:     Extraocular Movements: Extraocular movements intact.     Pupils: Pupils are equal, round, and reactive to light.  Cardiovascular:     Rate and Rhythm: Regular rhythm.     Chest Wall: PMI is not displaced.     Pulses: Normal pulses.     Heart sounds: Normal heart sounds. No murmur heard. Pulmonary:     Effort: Pulmonary effort is normal.     Breath sounds: Normal air entry. No rales.  Abdominal:     General: Abdomen is flat. Bowel sounds are normal. There is no distension.     Palpations:  Abdomen is soft. There is no hepatomegaly, splenomegaly or mass.     Tenderness: There is no abdominal tenderness.  Musculoskeletal:        General: Normal range of motion.     Cervical back: Normal range of motion and neck supple.     Right knee: Effusion present. Tenderness  present. ACL laxity present.     Right lower leg: No edema.     Left lower leg: No edema.  Skin:    General: Skin is warm and dry.  Neurological:     General: No focal deficit present.     Mental Status: He is alert and oriented to person, place, and time.     Cranial Nerves: No cranial nerve deficit.     Motor: No weakness.  Psychiatric:        Mood and Affect: Mood normal.        Behavior: Behavior normal.      Results for orders placed or performed in visit on 04/07/24  POCT CBG (Fasting - Glucose)  Result Value Ref Range   Glucose Fasting, POC 92 70 - 99 mg/dL  POC CREATINE & ALBUMIN,URINE  Result Value Ref Range   Microalbumin Ur, POC 30 mg/L   Creatinine, POC 50 mg/dL   Albumin/Creatinine Ratio, Urine, POC 30-300     Recent Results (from the past 2160 hours)  Lipid panel     Status: None   Collection Time: 04/06/24 10:22 AM  Result Value Ref Range   Cholesterol, Total 129 100 - 199 mg/dL   Triglycerides 75 0 - 149 mg/dL   HDL 63 >60 mg/dL   VLDL Cholesterol Cal 15 5 - 40 mg/dL   LDL Chol Calc (NIH) 51 0 - 99 mg/dL   Chol/HDL Ratio 2.0 0.0 - 5.0 ratio    Comment:                                   T. Chol/HDL Ratio                                             Men  Women                               1/2 Avg.Risk  3.4    3.3                                   Avg.Risk  5.0    4.4                                2X Avg.Risk  9.6    7.1                                3X Avg.Risk 23.4   11.0   Hemoglobin A1c     Status: Abnormal   Collection Time: 04/06/24 10:23 AM  Result Value Ref Range   Hgb A1c MFr Bld 6.4 (H) 4.8 - 5.6 %    Comment:          Prediabetes: 5.7 - 6.4          Diabetes:  >6.4          Glycemic control for adults with diabetes: <7.0    Est. average glucose Bld gHb Est-mCnc 137 mg/dL  Uric acid     Status: None   Collection Time: 04/06/24 10:23  AM  Result Value Ref Range   Uric Acid 5.5 3.8 - 8.4 mg/dL    Comment:            Therapeutic target for gout patients: <6.0  POCT CBG (Fasting - Glucose)     Status: Normal   Collection Time: 04/07/24  2:53 PM  Result Value Ref Range   Glucose Fasting, POC 92 70 - 99 mg/dL  POC CREATINE & ALBUMIN,URINE     Status: Abnormal   Collection Time: 04/07/24  3:34 PM  Result Value Ref Range   Microalbumin Ur, POC 30 mg/L   Creatinine, POC 50 mg/dL   Albumin/Creatinine Ratio, Urine, POC 30-300       Assessment & Plan:  Alyjah was seen today for annual exam.  Type 2 diabetes mellitus without complication, without long-term current use of insulin  (HCC) -     POCT CBG (Fasting - Glucose) -     metFORMIN  HCl; Take 1 tablet (500 mg total) by mouth 2 (two) times daily.  Dispense: 180 tablet; Refill: 0 -     Hemoglobin A1c -     POC CREATINE & ALBUMIN,URINE -     Empagliflozin ; Take 1 tablet (10 mg total) by mouth daily before breakfast.  Dispense: 90 tablet; Refill: 0  Primary hypertension -     Chlorthalidone ; Take 1 tablet (25 mg total) by mouth every morning.  Dispense: 90 tablet; Refill: 0  Onychomycosis -     Terbinafine  HCl; Take 1 tablet (250 mg total) by mouth daily.  Dispense: 42 tablet; Refill: 0 -     Hepatic function panel  Typical atrial flutter (HCC) -     Apixaban ; Take 1 tablet (5 mg total) by mouth 2 (two) times daily.  Dispense: 180 tablet; Refill: 1  Primary insomnia -     traZODone  HCl; Take 1 tablet (50 mg total) by mouth at bedtime as needed.  Dispense: 90 tablet; Refill: 0  Pure hypercholesterolemia -     Lipid panel    Problem List Items Addressed This Visit       Cardiovascular and Mediastinum   Atrial flutter (HCC)   Relevant Medications   chlorthalidone  (HYGROTON ) 25 MG tablet    apixaban  (ELIQUIS ) 5 MG TABS tablet     Musculoskeletal and Integument   Onychomycosis   Relevant Medications   terbinafine  (LAMISIL ) 250 MG tablet   Other Relevant Orders   Hepatic function panel   Other Visit Diagnoses       Type 2 diabetes mellitus without complication, without long-term current use of insulin  (HCC)    -  Primary   Relevant Medications   metFORMIN  (GLUCOPHAGE ) 500 MG tablet   empagliflozin  (JARDIANCE ) 10 MG TABS tablet   Other Relevant Orders   POCT CBG (Fasting - Glucose) (Completed)   POC CREATINE & ALBUMIN,URINE (Completed)     Primary hypertension       Relevant Medications   chlorthalidone  (HYGROTON ) 25 MG tablet   apixaban  (ELIQUIS ) 5 MG TABS tablet     Primary insomnia       Relevant Medications   traZODone  (DESYREL ) 50 MG tablet     Pure hypercholesterolemia       Relevant Medications   chlorthalidone  (HYGROTON ) 25 MG tablet   apixaban  (ELIQUIS ) 5 MG TABS tablet       Return in 6 weeks (on 05/19/2024) for fu with labs prior.   Total time spent: 30 minutes. This time includes review of previous notes and results and  patient face to face interaction during today'Waris Rodger visit.    Sherrill Cinderella Perry, MD  04/07/2024   This document may have been prepared by [facility] Voice Recognition software and as such may include unintentional dictation errors.       [1] No Known Allergies [2] Outpatient Medications Prior to Visit  Medication Sig   Accu-Chek Softclix Lancets lancets Use as instructed   albuterol  (VENTOLIN  HFA) 108 (90 Base) MCG/ACT inhaler Inhale 2 puffs into the lungs every 6 (six) hours as needed for wheezing or shortness of breath.   amiodarone  (PACERONE ) 400 MG tablet Take 1 tablet (400 mg total) by mouth daily. (Patient not taking: Reported on 12/24/2023)   ASPIRIN  81 PO Take 1 tablet by mouth daily. (Patient not taking: Reported on 12/24/2023)   Blood Glucose Monitoring Suppl (ACCU-CHEK GUIDE) w/Device KIT 1 Device by Does not  apply route daily.   celecoxib  (CELEBREX ) 200 MG capsule Take 1 capsule by mouth once daily   Cholecalciferol (VITAMIN D-3) 125 MCG (5000 UT) TABS Take 5,000 Units by mouth daily.   [employer] TURMERIC CURCUMIN PO Take 250 mg by mouth daily.   fluticasone  (FLONASE ) 50 MCG/ACT nasal spray Place 1 spray into both nostrils daily.   glucose blood (ACCU-CHEK GUIDE TEST) test strip Use as instructed   Homeopathic Products (LEG CRAMPS PO) Take 10 mg by mouth as needed.   Lancets Misc. (ACCU-CHEK FASTCLIX LANCET) KIT 1 Device by Does not apply route daily.   methocarbamol  (ROBAXIN ) 500 MG tablet Take 500 mg by mouth as needed for muscle spasms.   Multiple Vitamin (MULTIVITAMIN) tablet Take 1 tablet by mouth daily.   rosuvastatin  (CRESTOR ) 40 MG tablet Take 1 tablet by mouth once daily   traMADol  (ULTRAM ) 50 MG tablet Take 50 mg by mouth every 6 (six) hours as needed.   [DISCONTINUED] chlorthalidone  (HYGROTON ) 25 MG tablet Take 1 tablet (25 mg total) by mouth every morning.   [DISCONTINUED] cyclobenzaprine  (FLEXERIL ) 10 MG tablet Take 10 mg by mouth 3 (three) times daily as needed for muscle spasms. (Patient not taking: Reported on 12/24/2023)   [DISCONTINUED] diltiazem  (CARDIZEM  CD) 120 MG 24 hr capsule Take 1 capsule (120 mg total) by mouth daily. (Patient not taking: Reported on 12/24/2023)   [DISCONTINUED] ELIQUIS  5 MG TABS tablet Take 1 tablet by mouth twice daily   [DISCONTINUED] metFORMIN  (GLUCOPHAGE ) 500 MG tablet Take 1 tablet (500 mg total) by mouth 2 (two) times daily.   [DISCONTINUED] metoprolol  tartrate (LOPRESSOR ) 100 MG tablet Take 1 tablet (100 mg total) by mouth 2 (two) times daily. (Patient not taking: Reported on 12/24/2023)   [DISCONTINUED] tiotropium (SPIRIVA ) 18 MCG inhalation capsule Place 18 mcg into inhaler and inhale daily. (Patient not taking: Reported on 12/24/2023)   [DISCONTINUED] traZODone  (DESYREL ) 50 MG tablet TAKE 1 TABLET BY MOUTH AT BEDTIME AS NEEDED   No  facility-administered medications prior to visit.  "

## 2024-04-24 NOTE — Progress Notes (Signed)
 David Anderson                                          MRN: [id number]   04/24/2024   The [facility] Quality Team Specialist reviewed this patient medical record for the purposes of chart review for care gap closure. The following were reviewed: chart review for care gap closure-kidney health evaluation for diabetes:eGFR  and uACR.    [facility] Quality Team

## 2024-05-19 ENCOUNTER — Ambulatory Visit: Admitting: Internal Medicine
